# Patient Record
Sex: Male | Born: 1935 | Race: White | Hispanic: No | Marital: Married | Smoking: Never smoker
Health system: Southern US, Community
[De-identification: ages and names within clinical notes are randomized; demographics above are authoritative.]

## PROBLEM LIST (undated history)

## (undated) DIAGNOSIS — C679 Malignant neoplasm of bladder, unspecified: Secondary | ICD-10-CM

## (undated) DIAGNOSIS — I714 Abdominal aortic aneurysm, without rupture: Secondary | ICD-10-CM

## (undated) DIAGNOSIS — E119 Type 2 diabetes mellitus without complications: Secondary | ICD-10-CM

## (undated) DIAGNOSIS — I480 Paroxysmal atrial fibrillation: Secondary | ICD-10-CM

## (undated) DIAGNOSIS — I10 Essential (primary) hypertension: Secondary | ICD-10-CM

## (undated) DIAGNOSIS — C649 Malignant neoplasm of unspecified kidney, except renal pelvis: Secondary | ICD-10-CM

## (undated) HISTORY — PX: ABDOMINAL AORTIC ANEURYSM REPAIR: SUR1152

## (undated) HISTORY — PX: ABDOMINAL SURGERY: SHX537

---

## 2017-07-11 ENCOUNTER — Inpatient Hospital Stay (HOSPITAL_COMMUNITY)
Admission: EM | Admit: 2017-07-11 | Discharge: 2017-07-18 | DRG: 872 | Disposition: A | Discharge status: Hospice | Payer: Medicare Other | Attending: Internal Medicine | Admitting: Internal Medicine

## 2017-07-11 ENCOUNTER — Other Ambulatory Visit: Payer: Self-pay

## 2017-07-11 ENCOUNTER — Emergency Department (HOSPITAL_COMMUNITY): Payer: Medicare Other

## 2017-07-11 ENCOUNTER — Encounter (HOSPITAL_COMMUNITY): Payer: Self-pay | Admitting: *Deleted

## 2017-07-11 DIAGNOSIS — R319 Hematuria, unspecified: Secondary | ICD-10-CM

## 2017-07-11 DIAGNOSIS — Z85528 Personal history of other malignant neoplasm of kidney: Secondary | ICD-10-CM | POA: Diagnosis not present

## 2017-07-11 DIAGNOSIS — F329 Major depressive disorder, single episode, unspecified: Secondary | ICD-10-CM | POA: Diagnosis present

## 2017-07-11 DIAGNOSIS — R296 Repeated falls: Secondary | ICD-10-CM | POA: Diagnosis present

## 2017-07-11 DIAGNOSIS — E785 Hyperlipidemia, unspecified: Secondary | ICD-10-CM | POA: Diagnosis present

## 2017-07-11 DIAGNOSIS — R338 Other retention of urine: Secondary | ICD-10-CM | POA: Diagnosis not present

## 2017-07-11 DIAGNOSIS — E860 Dehydration: Secondary | ICD-10-CM | POA: Diagnosis present

## 2017-07-11 DIAGNOSIS — I10 Essential (primary) hypertension: Secondary | ICD-10-CM | POA: Diagnosis present

## 2017-07-11 DIAGNOSIS — R310 Gross hematuria: Secondary | ICD-10-CM | POA: Diagnosis present

## 2017-07-11 DIAGNOSIS — Z515 Encounter for palliative care: Secondary | ICD-10-CM | POA: Diagnosis not present

## 2017-07-11 DIAGNOSIS — Z66 Do not resuscitate: Secondary | ICD-10-CM

## 2017-07-11 DIAGNOSIS — N390 Urinary tract infection, site not specified: Secondary | ICD-10-CM

## 2017-07-11 DIAGNOSIS — Z9181 History of falling: Secondary | ICD-10-CM

## 2017-07-11 DIAGNOSIS — R531 Weakness: Secondary | ICD-10-CM

## 2017-07-11 DIAGNOSIS — N179 Acute kidney failure, unspecified: Secondary | ICD-10-CM | POA: Diagnosis present

## 2017-07-11 DIAGNOSIS — Z8551 Personal history of malignant neoplasm of bladder: Secondary | ICD-10-CM

## 2017-07-11 DIAGNOSIS — R630 Anorexia: Secondary | ICD-10-CM | POA: Diagnosis present

## 2017-07-11 DIAGNOSIS — N189 Chronic kidney disease, unspecified: Secondary | ICD-10-CM | POA: Diagnosis present

## 2017-07-11 DIAGNOSIS — J8410 Pulmonary fibrosis, unspecified: Secondary | ICD-10-CM | POA: Diagnosis present

## 2017-07-11 DIAGNOSIS — R918 Other nonspecific abnormal finding of lung field: Secondary | ICD-10-CM | POA: Diagnosis present

## 2017-07-11 DIAGNOSIS — N136 Pyonephrosis: Secondary | ICD-10-CM | POA: Diagnosis present

## 2017-07-11 DIAGNOSIS — F0390 Unspecified dementia without behavioral disturbance: Secondary | ICD-10-CM | POA: Diagnosis present

## 2017-07-11 DIAGNOSIS — I129 Hypertensive chronic kidney disease with stage 1 through stage 4 chronic kidney disease, or unspecified chronic kidney disease: Secondary | ICD-10-CM | POA: Diagnosis present

## 2017-07-11 DIAGNOSIS — E278 Other specified disorders of adrenal gland: Secondary | ICD-10-CM | POA: Diagnosis present

## 2017-07-11 DIAGNOSIS — Z79899 Other long term (current) drug therapy: Secondary | ICD-10-CM

## 2017-07-11 DIAGNOSIS — K449 Diaphragmatic hernia without obstruction or gangrene: Secondary | ICD-10-CM | POA: Diagnosis present

## 2017-07-11 DIAGNOSIS — C7951 Secondary malignant neoplasm of bone: Secondary | ICD-10-CM | POA: Diagnosis not present

## 2017-07-11 DIAGNOSIS — E871 Hypo-osmolality and hyponatremia: Secondary | ICD-10-CM | POA: Diagnosis present

## 2017-07-11 DIAGNOSIS — N1330 Unspecified hydronephrosis: Secondary | ICD-10-CM | POA: Diagnosis not present

## 2017-07-11 DIAGNOSIS — I2510 Atherosclerotic heart disease of native coronary artery without angina pectoris: Secondary | ICD-10-CM | POA: Diagnosis present

## 2017-07-11 DIAGNOSIS — C787 Secondary malignant neoplasm of liver and intrahepatic bile duct: Secondary | ICD-10-CM | POA: Diagnosis present

## 2017-07-11 DIAGNOSIS — F32A Depression, unspecified: Secondary | ICD-10-CM | POA: Diagnosis present

## 2017-07-11 DIAGNOSIS — E876 Hypokalemia: Secondary | ICD-10-CM | POA: Diagnosis present

## 2017-07-11 DIAGNOSIS — A419 Sepsis, unspecified organism: Principal | ICD-10-CM | POA: Diagnosis present

## 2017-07-11 DIAGNOSIS — E1136 Type 2 diabetes mellitus with diabetic cataract: Secondary | ICD-10-CM | POA: Diagnosis present

## 2017-07-11 DIAGNOSIS — Z8673 Personal history of transient ischemic attack (TIA), and cerebral infarction without residual deficits: Secondary | ICD-10-CM

## 2017-07-11 DIAGNOSIS — I480 Paroxysmal atrial fibrillation: Secondary | ICD-10-CM | POA: Diagnosis present

## 2017-07-11 DIAGNOSIS — I4891 Unspecified atrial fibrillation: Secondary | ICD-10-CM

## 2017-07-11 DIAGNOSIS — I700 Atherosclerosis of aorta: Secondary | ICD-10-CM | POA: Diagnosis present

## 2017-07-11 DIAGNOSIS — Z905 Acquired absence of kidney: Secondary | ICD-10-CM

## 2017-07-11 DIAGNOSIS — Z7982 Long term (current) use of aspirin: Secondary | ICD-10-CM

## 2017-07-11 DIAGNOSIS — Z885 Allergy status to narcotic agent status: Secondary | ICD-10-CM

## 2017-07-11 DIAGNOSIS — I358 Other nonrheumatic aortic valve disorders: Secondary | ICD-10-CM | POA: Diagnosis present

## 2017-07-11 DIAGNOSIS — Z87440 Personal history of urinary (tract) infections: Secondary | ICD-10-CM

## 2017-07-11 DIAGNOSIS — I252 Old myocardial infarction: Secondary | ICD-10-CM

## 2017-07-11 HISTORY — DX: Malignant neoplasm of unspecified kidney, except renal pelvis: C64.9

## 2017-07-11 HISTORY — DX: Abdominal aortic aneurysm, without rupture: I71.4

## 2017-07-11 HISTORY — DX: Essential (primary) hypertension: I10

## 2017-07-11 HISTORY — DX: Type 2 diabetes mellitus without complications: E11.9

## 2017-07-11 HISTORY — DX: Malignant neoplasm of bladder, unspecified: C67.9

## 2017-07-11 HISTORY — DX: Paroxysmal atrial fibrillation: I48.0

## 2017-07-11 LAB — CBC WITH DIFFERENTIAL/PLATELET
Basophils Absolute: 0 10*3/uL (ref 0.0–0.1)
Basophils Relative: 0 %
EOS ABS: 0.1 10*3/uL (ref 0.0–0.7)
EOS PCT: 1 %
HEMATOCRIT: 38.8 % — AB (ref 39.0–52.0)
Hemoglobin: 13.2 g/dL (ref 13.0–17.0)
Lymphocytes Relative: 7 %
Lymphs Abs: 0.9 10*3/uL (ref 0.7–4.0)
MCH: 32.9 pg (ref 26.0–34.0)
MCHC: 34 g/dL (ref 30.0–36.0)
MCV: 96.8 fL (ref 78.0–100.0)
MONO ABS: 1.4 10*3/uL — AB (ref 0.1–1.0)
MONOS PCT: 10 %
Neutro Abs: 11.7 10*3/uL — ABNORMAL HIGH (ref 1.7–7.7)
Neutrophils Relative %: 82 %
Platelets: 177 10*3/uL (ref 150–400)
RBC: 4.01 MIL/uL — ABNORMAL LOW (ref 4.22–5.81)
RDW: 13.6 % (ref 11.5–15.5)
WBC: 14.1 10*3/uL — ABNORMAL HIGH (ref 4.0–10.5)

## 2017-07-11 LAB — COMPREHENSIVE METABOLIC PANEL
ALT: 57 U/L (ref 17–63)
ANION GAP: 13 (ref 5–15)
AST: 46 U/L — ABNORMAL HIGH (ref 15–41)
Albumin: 4.5 g/dL (ref 3.5–5.0)
Alkaline Phosphatase: 150 U/L — ABNORMAL HIGH (ref 38–126)
BILIRUBIN TOTAL: 1.6 mg/dL — AB (ref 0.3–1.2)
BUN: 28 mg/dL — ABNORMAL HIGH (ref 6–20)
CO2: 24 mmol/L (ref 22–32)
Calcium: 9.8 mg/dL (ref 8.9–10.3)
Chloride: 91 mmol/L — ABNORMAL LOW (ref 101–111)
Creatinine, Ser: 1.26 mg/dL — ABNORMAL HIGH (ref 0.61–1.24)
GFR calc Af Amer: 59 mL/min — ABNORMAL LOW (ref >60)
GFR calc non Af Amer: 51 mL/min — ABNORMAL LOW (ref >60)
GLUCOSE: 133 mg/dL — AB (ref 65–99)
POTASSIUM: 3.9 mmol/L (ref 3.5–5.1)
Sodium: 128 mmol/L — ABNORMAL LOW (ref 135–145)
TOTAL PROTEIN: 7.6 g/dL (ref 6.5–8.1)

## 2017-07-11 LAB — URINALYSIS, ROUTINE W REFLEX MICROSCOPIC
BILIRUBIN URINE: NEGATIVE
GLUCOSE, UA: NEGATIVE mg/dL
KETONES UR: 5 mg/dL — AB
LEUKOCYTES UA: NEGATIVE
Nitrite: NEGATIVE
PH: 5 (ref 5.0–8.0)
Protein, ur: 100 mg/dL — AB
Specific Gravity, Urine: 1.014 (ref 1.005–1.030)

## 2017-07-11 LAB — TROPONIN I: Troponin I: <0.03 ng/mL (ref <0.03)

## 2017-07-11 MED ORDER — DOCUSATE SODIUM 100 MG PO CAPS: 100.0000 mg | ORAL_CAPSULE | Freq: Every day | ORAL | Status: DC | PRN

## 2017-07-11 MED ORDER — ONDANSETRON HCL 4 MG PO TABS: 4.0000 mg | ORAL_TABLET | Freq: Four times a day (QID) | ORAL | Status: DC | PRN

## 2017-07-11 MED ORDER — AMLODIPINE BESYLATE 5 MG PO TABS
10.0000 mg | ORAL_TABLET | Freq: Every day | ORAL | Status: DC
  Administered 2017-07-12 – 2017-07-17 (×6): 10 mg via ORAL
  Filled 2017-07-11 (×6): qty 2

## 2017-07-11 MED ORDER — DEXTROSE 5 % IV SOLN
1.0000 g | Freq: Once | INTRAVENOUS | Status: AC
  Administered 2017-07-12: 1 g via INTRAVENOUS
  Filled 2017-07-11: qty 10

## 2017-07-11 MED ORDER — ASPIRIN EC 81 MG PO TBEC
81.0000 mg | DELAYED_RELEASE_TABLET | Freq: Every day | ORAL | Status: DC
  Administered 2017-07-12 – 2017-07-18 (×7): 81 mg via ORAL
  Filled 2017-07-11 (×7): qty 1

## 2017-07-11 MED ORDER — ONDANSETRON HCL 4 MG/2ML IJ SOLN: 4.0000 mg | Freq: Four times a day (QID) | INTRAMUSCULAR | Status: DC | PRN

## 2017-07-11 MED ORDER — ACETAMINOPHEN 650 MG RE SUPP: 650.0000 mg | Freq: Four times a day (QID) | RECTAL | Status: DC | PRN

## 2017-07-11 MED ORDER — ESCITALOPRAM OXALATE 10 MG PO TABS
10.0000 mg | ORAL_TABLET | Freq: Every day | ORAL | Status: DC
  Administered 2017-07-12 – 2017-07-17 (×6): 10 mg via ORAL
  Filled 2017-07-11 (×9): qty 1

## 2017-07-11 MED ORDER — DONEPEZIL HCL 5 MG PO TABS
5.0000 mg | ORAL_TABLET | Freq: Every day | ORAL | Status: DC
  Administered 2017-07-12 – 2017-07-17 (×7): 5 mg via ORAL
  Filled 2017-07-11 (×10): qty 1

## 2017-07-11 MED ORDER — ALBUTEROL SULFATE (2.5 MG/3ML) 0.083% IN NEBU: 2.5000 mg | INHALATION_SOLUTION | Freq: Four times a day (QID) | RESPIRATORY_TRACT | Status: DC | PRN

## 2017-07-11 MED ORDER — SODIUM CHLORIDE 0.9 % IV SOLN
INTRAVENOUS | Status: DC
  Administered 2017-07-12 – 2017-07-16 (×9): via INTRAVENOUS

## 2017-07-11 MED ORDER — ACETAMINOPHEN 325 MG PO TABS
650.0000 mg | ORAL_TABLET | Freq: Four times a day (QID) | ORAL | Status: DC | PRN
  Administered 2017-07-12 – 2017-07-14 (×3): 650 mg via ORAL
  Filled 2017-07-11 (×3): qty 2

## 2017-07-11 MED ORDER — CARVEDILOL 12.5 MG PO TABS
12.5000 mg | ORAL_TABLET | Freq: Two times a day (BID) | ORAL | Status: DC
  Administered 2017-07-12 – 2017-07-18 (×12): 12.5 mg via ORAL
  Filled 2017-07-11 (×13): qty 1

## 2017-07-11 MED ORDER — ATORVASTATIN CALCIUM 20 MG PO TABS
20.0000 mg | ORAL_TABLET | Freq: Every day | ORAL | Status: DC
  Administered 2017-07-12 – 2017-07-16 (×5): 20 mg via ORAL
  Filled 2017-07-11 (×7): qty 1

## 2017-07-11 MED ORDER — BENAZEPRIL HCL 10 MG PO TABS
20.0000 mg | ORAL_TABLET | Freq: Every day | ORAL | Status: DC
  Administered 2017-07-12 (×2): 20 mg via ORAL
  Filled 2017-07-11 (×2): qty 1
  Filled 2017-07-11 (×2): qty 2
  Filled 2017-07-11: qty 1

## 2017-07-11 MED ORDER — SODIUM CHLORIDE 0.9 % IV BOLUS (SEPSIS)
1000.0000 mL | Freq: Once | INTRAVENOUS | Status: AC
Start: 2017-07-11 — End: 2017-07-11
  Administered 2017-07-11: 1000 mL via INTRAVENOUS

## 2017-07-11 NOTE — ED Notes (Signed)
Pt reports he has been falling frequently for the last few days.  States he last fell today, does not recall if he hit his head.  Pt states he got weak and fell and he might have passed out some of the other times he fell.  EDP notified

## 2017-07-11 NOTE — ED Triage Notes (Signed)
Pt brought in by Ricky Blankenship for possible sepsis; Dr. Lunette Stands wanted pt to be taken to hospital and be checked out; pt was recently discharged from the hospital for respiratory infection; pt is alert but family reports pt has been sleeping a lot today and decrease in urine; pt's cbg 120

## 2017-07-11 NOTE — ED Provider Notes (Signed)
Care discussed with the family, I examined the patient and obtain my own history.  I agree with Dr. Lacinda Axon.  The patient is generally weak, there is hematuria which could represent an infection.  He does have a leukocytosis of 14,000.  I have discussed his care with the hospitalist, Dr. Tamala Julian who will admit the patient to the hospital.   Noemi Chapel, MD 07/11/17 2336

## 2017-07-11 NOTE — ED Provider Notes (Addendum)
[facility] EMERGENCY DEPARTMENT Provider Note   CSN: [id number] Arrival date & time: 07/11/17  2040     History   Chief Complaint Chief Complaint  Patient presents with  . Generalized Body Aches    HPI Ricky Blankenship is a 83 y.o. male.  Level 5 caveat for weakness.  Patient lives in [place].  He has had generalized weakness since a bout of bronchitis approximately 4 weeks ago.  He has had trouble standing up and walking.  Past medical history includes hypertension, diabetes, MI in 1986, AAA repair in 1996, renal cancer, stent in residual kidney, intermittent atrial fibrillation.  No chest pain, dyspnea, fever, sweats, chills, dysuria.      Past Medical History:  Diagnosis Date  . Abdominal aortic aneurysm (AAA) ([place])   . Bladder cancer ([place])   . Cancer of kidney excluding renal pelvis ([place])   . Diabetes mellitus without complication ([place])   . Hypertension     There are no active problems to display for this patient.   Past Surgical History:  Procedure Laterality Date  . ABDOMINAL AORTIC ANEURYSM REPAIR    . ABDOMINAL SURGERY         Home Medications    Prior to Admission medications   Medication Sig Start Date End Date Taking? Authorizing Provider  amLODipine (NORVASC) 10 MG tablet Take 10 mg daily by mouth.   Yes [provider]  aspirin EC 81 MG tablet Take 81 mg daily by mouth.   Yes [provider]  atorvastatin (LIPITOR) 20 MG tablet Take 20 mg daily by mouth.   Yes [provider]  benazepril (LOTENSIN) 20 MG tablet Take 20 mg at bedtime by mouth.   Yes [provider]  carvedilol (COREG) 12.5 MG tablet Take 12.5 mg 2 (two) times daily with a meal by mouth.   Yes [provider]  docusate sodium (COLACE) 100 MG capsule Take 100 mg daily by mouth.   Yes [provider]  donepezil (ARICEPT) 5 MG tablet Take 5 mg at bedtime by mouth.   Yes [provider]  escitalopram (LEXAPRO) 10 MG  tablet Take 10 mg daily by mouth.   Yes [provider]  Flax Oil-Fish Oil-Borage Oil (FISH-FLAX-BORAGE) CAPS Take 1 capsule daily by mouth.   Yes [provider]  Multiple Vitamins-Minerals (EQ COMPLETE MULTIVIT ADULT 50+) TABS Take 1 tablet daily by mouth.   Yes [provider]    Family History History reviewed. No pertinent family history.  Social History Social History   Tobacco Use  . Smoking status: Never Smoker  . Smokeless tobacco: Never Used  Substance Use Topics  . Alcohol use: No    Frequency: Never  . Drug use: No     Allergies   Morphine and related   Review of Systems Review of Systems  Unable to perform ROS: Acuity of condition     Physical Exam Updated Vital Signs BP 116/75 (BP Location: Left Arm)   Pulse 99   Temp 98.5 F (36.9 C) (Oral)   Resp (!) 22   Ht 5\' 8"  (1.727 m)   Wt 97.5 kg (215 lb)   SpO2 96%   BMI 32.69 kg/m   Physical Exam  Constitutional: He is oriented to person, place, and time.  Pale, alert, nad  HENT:  Head: Normocephalic and atraumatic.  Eyes: Conjunctivae are normal.  Neck: Neck supple.  Cardiovascular: Normal rate and regular rhythm.  Pulmonary/Chest: Effort normal and breath sounds normal.  Abdominal: Soft. Bowel sounds are normal.  Musculoskeletal: Normal range of motion.  Neurological: He is alert and oriented to person, place, and time.  Skin: Skin is warm and dry.  Psychiatric: He has a normal mood and affect. His behavior is normal.  Nursing note and vitals reviewed.    ED Treatments / Results  Labs (all labs ordered are listed, but only abnormal results are displayed) Labs Reviewed  CBC WITH DIFFERENTIAL/PLATELET  COMPREHENSIVE METABOLIC PANEL  URINALYSIS, ROUTINE W REFLEX MICROSCOPIC  TROPONIN I    EKG  EKG Interpretation  Date/Time:  Friday July 11 2017 21:12:49 EST Ventricular Rate:  119 PR Interval:    QRS Duration: 122 QT Interval:  351 QTC  Calculation: 494 R Axis:   86 Text Interpretation:  Atrial fibrillation Nonspecific intraventricular conduction delay Borderline ST depression, diffuse leads Confirmed by Nat Christen [phone]) on 07/11/2017 9:26:36 PM       Radiology No results found.  Procedures Procedures (including critical care time)  Medications Ordered in ED Medications  sodium chloride 0.9 % bolus 1,000 mL (not administered)     Initial Impression / Assessment and Plan / ED Course  I have reviewed the triage vital signs and the nursing notes.  Pertinent labs & imaging results that were available during my care of the patient were reviewed by me and considered in my medical decision making (see chart for details).    Daughter reports general decline in his condition since a viral illness approximately 1 month ago.  Will get screening labs, chest x-ray, urinalysis, start IV fluids.  Discussed with Dr. Sabra Heck   Final Clinical Impressions(s) / ED Diagnoses   Final diagnoses:  Weakness  Atrial fibrillation, unspecified type [facility])    ED Discharge Orders    None       Nat Christen, MD 07/11/17 2125    Nat Christen, MD 07/11/17 2147    Nat Christen, MD 07/11/17 2207

## 2017-07-11 NOTE — H&P (Signed)
History and Physical    Ricky Blankenship NLZ:[id number] DOB: 1932-03-07 DOA: 07/11/2017  Referring MD/NP/PA: Dr. Noemi Chapel PCP: Kendrick Ranch, MD  Patient coming from: Home  Chief Complaint: Weakness   HPI: Ricky Blankenship is a 83 y.o. male with medical history significant of HTN, PAF not on anticoagulation, CAD, MI in 1986, AAA s/p surgical repair, mild dementia, RCC s/p nephrectomy, and bladder cancer; who presents with complaints of generalized weakness. Patient and his daughter to provide history.  All of the patient's doctors are in [place], [place].  At baseline the patient lives at home with his wife and is her primary caregiver. Symptoms appear to have been progressively worsening over the last 4 weeks after patient had been diagnosed with bronchitis.  He notes that he may have been given antibiotics and took them as directed.  He has had generalized weakness and has gotten to the point in which he is unable to walk over the last week.  Previously falling once or twice per week now every day.  He reports his legs just not being able to hold him up.  Associated symptoms include incontinence of urine/bowel, chills, poor oral intake, and reports of new tremors.  Denies any recent changes in medications or chest pain. He was seen in the emergency department in [place] 5 days ago, but noted to have low sodium levels and discharged home.  His PCP had come to see him today and recommended him to come to the hospital for further evaluation.  The patient is followed by Dr. Edwin Dada of urology and reportedly has been routinely followed up.  Patient last received chemo sometime this summer for recurrence of bladder cancer, but reportedly has been cancer free at this time.  Patient previously was noted to to be in atrial fibrillation, but reports that he was evaluated by a cardiologist but never started on blood thinners.  ED Course: Upon admission into the emergency department  patient was noted to be afebrile, pulse 99-114, respirations 17-29, blood pressures maintained, and O2 saturations maintained on room air.  Labs revealed WBC 14.1, sodium 128, potassium 3.9, chloride 91, BUN 28, creatinine 1.26, AST 46, ALT 57, AP 150, and total bilirubin 1.6.  Chest x-ray showed no acute abnormalities. CT scan of the brain showed no acute abnormalities.  Urinalysis was positive for large hemoglobin, rare bacteria, TNTC RBCs, and 6-30 WBCs.  Patient was given 1 L normal saline IV fluids and started on Rocephin for the possibility of a urinary tract infection.  Review of Systems  Constitutional: Positive for chills and malaise/fatigue.  HENT: Negative for ear discharge and nosebleeds.   Eyes: Negative for pain and discharge.  Respiratory: Negative for cough and shortness of breath.   Cardiovascular: Negative for chest pain and leg swelling.  Gastrointestinal: Positive for diarrhea and nausea. Negative for vomiting.  Genitourinary: Positive for frequency. Negative for hematuria.       Positive for urinary incontinence  Musculoskeletal: Positive for falls.  Skin: Negative for itching and rash.  Neurological: Positive for weakness. Negative for speech change, focal weakness and loss of consciousness.  Psychiatric/Behavioral: Positive for memory loss. Negative for substance abuse.    Past Medical History:  Diagnosis Date  . Abdominal aortic aneurysm (AAA) ([place])   . Bladder cancer ([place])   . Cancer of kidney excluding renal pelvis ([place])   . Diabetes mellitus without complication ([place])   . Hypertension   . PAF (paroxysmal atrial fibrillation) ([place])  Past Surgical History:  Procedure Laterality Date  . ABDOMINAL AORTIC ANEURYSM REPAIR    . ABDOMINAL SURGERY       reports that  has never smoked. he has never used smokeless tobacco. He reports that he does not drink alcohol or use drugs.  Allergies  Allergen Reactions  . Morphine And Related     Levels drop too low     History reviewed. No pertinent family history.  Prior to Admission medications   Medication Sig Start Date End Date Taking? Authorizing Provider  amLODipine (NORVASC) 10 MG tablet Take 10 mg daily by mouth.   Yes [provider]  aspirin EC 81 MG tablet Take 81 mg daily by mouth.   Yes [provider]  atorvastatin (LIPITOR) 20 MG tablet Take 20 mg daily by mouth.   Yes [provider]  benazepril (LOTENSIN) 20 MG tablet Take 20 mg at bedtime by mouth.   Yes [provider]  carvedilol (COREG) 12.5 MG tablet Take 12.5 mg 2 (two) times daily with a meal by mouth.   Yes [provider]  docusate sodium (COLACE) 100 MG capsule Take 100 mg daily by mouth.   Yes [provider]  donepezil (ARICEPT) 5 MG tablet Take 5 mg at bedtime by mouth.   Yes [provider]  escitalopram (LEXAPRO) 10 MG tablet Take 10 mg daily by mouth.   Yes [provider]  Flax Oil-Fish Oil-Borage Oil (FISH-FLAX-BORAGE) CAPS Take 1 capsule daily by mouth.   Yes [provider]  Multiple Vitamins-Minerals (EQ COMPLETE MULTIVIT ADULT 50+) TABS Take 1 tablet daily by mouth.   Yes [provider]    Physical Exam:  Constitutional: Elderly male who appears in NAD, calm, comfortable Vitals:   07/11/17 2100 07/11/17 2130 07/11/17 2200 07/11/17 2300  BP: (!) 120/91 123/78 (!) 172/79 (!) 140/95  Pulse: (!) 101 (!) 114    Resp: (!) 21 (!) 26 (!) 29 17  Temp:      TempSrc:      SpO2: (!) 82% 99% 100% 96%  Weight:      Height:       Eyes: PERRL, lids and conjunctivae normal ENMT: Mucous membranes are dry. Posterior pharynx clear of any exudate or lesions.  Neck: normal, supple, no masses, no thyromegaly Respiratory: clear to auscultation bilaterally, no wheezing, no crackles. Normal respiratory effort. No accessory muscle use.  Cardiovascular: Irregular irregular, no murmurs / rubs / gallops. No extremity edema. 2+ pedal pulses.  No carotid bruits.  Abdomen: no tenderness, midline abdominal fullness noted. No hepatosplenomegaly. Bowel sounds positive.  Musculoskeletal: no clubbing / cyanosis. No joint deformity upper and lower extremities. Good ROM, no contractures. Normal muscle tone.  Skin: no rashes, lesions, ulcers. No induration.  Poor skin turgor. Neurologic: CN 2-12 grossly intact. Sensation intact, DTR normal. Strength 5/5 in all 4.  Psychiatric: Normal judgment and insight. Alert and oriented x 3. Normal mood.     Labs on Admission: I have personally reviewed following labs and imaging studies  CBC: Recent Labs  Lab 07/11/17 2127  WBC 14.1*  NEUTROABS 11.7*  HGB 13.2  HCT 38.8*  MCV 96.8  PLT 016   Basic Metabolic Panel: Recent Labs  Lab 07/11/17 2127  NA 128*  K 3.9  CL 91*  CO2 24  GLUCOSE 133*  BUN 28*  CREATININE 1.26*  CALCIUM 9.8   GFR: Estimated Creatinine Clearance: 50.3 mL/min (A) (by C-G formula based on SCr of 1.26 mg/dL (  H)). Liver Function Tests: Recent Labs  Lab 07/11/17 2127  AST 46*  ALT 57  ALKPHOS 150*  BILITOT 1.6*  PROT 7.6  ALBUMIN 4.5   No results for input(s): LIPASE, AMYLASE in the last 168 hours. No results for input(s): AMMONIA in the last 168 hours. Coagulation Profile: No results for input(s): INR, PROTIME in the last 168 hours. Cardiac Enzymes: Recent Labs  Lab 07/11/17 2127  TROPONINI <0.03   BNP (last 3 results) No results for input(s): PROBNP in the last 8760 hours. HbA1C: No results for input(s): HGBA1C in the last 72 hours. CBG: No results for input(s): GLUCAP in the last 168 hours. Lipid Profile: No results for input(s): CHOL, HDL, LDLCALC, TRIG, CHOLHDL, LDLDIRECT in the last 72 hours. Thyroid Function Tests: No results for input(s): TSH, T4TOTAL, FREET4, T3FREE, THYROIDAB in the last 72 hours. Anemia Panel: No results for input(s): VITAMINB12, FOLATE, FERRITIN, TIBC, IRON, RETICCTPCT in the last 72 hours. Urine analysis:     Component Value Date/Time   COLORURINE YELLOW 07/11/2017 2240   APPEARANCEUR CLOUDY (A) 07/11/2017 2240   LABSPEC 1.014 07/11/2017 2240   PHURINE 5.0 07/11/2017 2240   GLUCOSEU NEGATIVE 07/11/2017 2240   HGBUR LARGE (A) 07/11/2017 2240   BILIRUBINUR NEGATIVE 07/11/2017 2240   KETONESUR 5 (A) 07/11/2017 2240   PROTEINUR 100 (A) 07/11/2017 2240   NITRITE NEGATIVE 07/11/2017 2240   LEUKOCYTESUR NEGATIVE 07/11/2017 2240   Sepsis Labs: No results found for this or any previous visit (from the past 240 hour(s)).   Radiological Exams on Admission: Ct Head Wo Contrast  Result Date: 07/11/2017 CLINICAL DATA:  83 year old male with generalized weakness. EXAM: CT HEAD WITHOUT CONTRAST TECHNIQUE: Contiguous axial images were obtained from the base of the skull through the vertex without intravenous contrast. COMPARISON:  None. FINDINGS: Brain: There is moderate age-related atrophy and chronic microvascular ischemic changes. Small left lentiform nucleus old lacunar infarct. There is no acute intracranial hemorrhage. No mass effect midline shift noted. No extra-axial fluid collection. Vascular: There is atherosclerotic calcification of the cavernous ICA and right MCA. Atherosclerotic calcification of the right vertebral artery. Skull: Normal. Negative for fracture or focal lesion. Sinuses/Orbits: No acute finding. Other: Bilateral cataract surgeries. IMPRESSION: 1. No acute intracranial hemorrhage. 2. Moderate age-related atrophy and chronic microvascular ischemic changes. If symptoms persist, and there are no contraindications, MRI may provide better evaluation if clinically indicated. Electronically Signed   By: Anner Crete M.D.   On: 07/11/2017 22:15   Dg Chest Port 1 View  Result Date: 07/11/2017 CLINICAL DATA:  Weakness for 1 month EXAM: PORTABLE CHEST 1 VIEW COMPARISON:  None. FINDINGS: Cardiac shadow is within normal limits. Aortic calcifications are noted. The lungs are well aerated  bilaterally. No focal infiltrate or sizable effusion is seen. Prominent calcification is noted the costal sternal margins of the first ribs bilaterally. IMPRESSION: No acute abnormality noted. Electronically Signed   By: Inez Catalina M.D.   On: 07/11/2017 21:35    EKG: Independently reviewed.  afibrillation at a rate of 119  Assessment/Plan Suspected sepsis 2/2 UTI: Acute.  Patient presents with WBC 14.1, elevated heart rates, and tachypnea.  UA positive for hematuria and elevated WBCs.  Suspect urinary tract infection as cause of symptoms. - Admit to a telemetry bed - Sepsis protocol initiated - Add on lactic acid - Follow-up blood and urine cultures - Continue Rocephin per pharmacy  Hematuria: Possibly secondary to above.  Note patient with history of malignancy. - Check renal ultrasound -  Monitor intake and output  Acute kidney injury vs. chronic kidney disease:  Patient's initial creatinine noted to be 1.26 and BUN 28.  His baseline creatinine is unknown at this time, but presents with elevated BUN to creatinine to suggest dehydration. - IV fluids of normal saline at 75 mL/h  Generalized weakness, falls, dehydration: acute - Physical therapy to eval and treat in a.m. - Social work consult for possible need of rehab  Hyponatremia: Initial sodium noted to be 128 on admission.  Suspect sodium possibly related to dehydration and/or medication. - IV fluids as seen above - Recheck BMP in a.m.  Paroxysmal atrial fibrillation: Acute.  Patient found to be in atrial fibrillation at rate of 119 bpm.  Patient reports previous history of atrial fibrillation noted back in January, but not on any anticoagulants. CHADsVASc = at least4(HTN, age, and vascular disease).  Patient also noted to be a significant fall risks. - Continue Coreg for rate control check TSH - Check TSH - echocardiogram - May warrant a to consult cardiology in a.m.  Essential hypertension - Continue benazepril, amlodipine,  and Coreg  H/O renal cell carcinoma and bladder cancer: Patient status post nephrectomy and reported chemotherapy followed by Edwin Dada of urology.  Dementia:mild - Continue Aricept  Depression - Continue Lexapro  DVT prophylaxis: scd Code Status: Full Family Communication: Plan of care with the patient and family present at bedside Disposition Plan: TBD Consults called: None  Admission status: Inpatient  Norval Morton MD [facility] Pager [phone]   If 7PM-7AM, please contact night-coverage [email or web address] Password [facility]  07/11/2017, 11:32 PM

## 2017-07-12 ENCOUNTER — Inpatient Hospital Stay (HOSPITAL_COMMUNITY): Payer: Medicare Other

## 2017-07-12 ENCOUNTER — Other Ambulatory Visit: Payer: Self-pay

## 2017-07-12 DIAGNOSIS — R310 Gross hematuria: Secondary | ICD-10-CM | POA: Diagnosis present

## 2017-07-12 DIAGNOSIS — I10 Essential (primary) hypertension: Secondary | ICD-10-CM | POA: Diagnosis present

## 2017-07-12 DIAGNOSIS — E871 Hypo-osmolality and hyponatremia: Secondary | ICD-10-CM | POA: Diagnosis present

## 2017-07-12 DIAGNOSIS — I480 Paroxysmal atrial fibrillation: Secondary | ICD-10-CM | POA: Diagnosis present

## 2017-07-12 DIAGNOSIS — F329 Major depressive disorder, single episode, unspecified: Secondary | ICD-10-CM | POA: Diagnosis present

## 2017-07-12 DIAGNOSIS — R531 Weakness: Secondary | ICD-10-CM

## 2017-07-12 DIAGNOSIS — I4891 Unspecified atrial fibrillation: Secondary | ICD-10-CM

## 2017-07-12 DIAGNOSIS — E860 Dehydration: Secondary | ICD-10-CM | POA: Diagnosis present

## 2017-07-12 DIAGNOSIS — Z85528 Personal history of other malignant neoplasm of kidney: Secondary | ICD-10-CM

## 2017-07-12 DIAGNOSIS — F32A Depression, unspecified: Secondary | ICD-10-CM | POA: Diagnosis present

## 2017-07-12 LAB — BASIC METABOLIC PANEL
Anion gap: 13 (ref 5–15)
BUN: 28 mg/dL — ABNORMAL HIGH (ref 6–20)
CALCIUM: 9.6 mg/dL (ref 8.9–10.3)
CO2: 22 mmol/L (ref 22–32)
CREATININE: 1.17 mg/dL (ref 0.61–1.24)
Chloride: 95 mmol/L — ABNORMAL LOW (ref 101–111)
GFR, EST NON AFRICAN AMERICAN: 56 mL/min — AB (ref >60)
GLUCOSE: 117 mg/dL — AB (ref 65–99)
Potassium: 3.7 mmol/L (ref 3.5–5.1)
Sodium: 130 mmol/L — ABNORMAL LOW (ref 135–145)

## 2017-07-12 LAB — PROTIME-INR
INR: 1.02
Prothrombin Time: 13.3 seconds (ref 11.4–15.2)

## 2017-07-12 LAB — ECHOCARDIOGRAM COMPLETE
Height: 68 in
Weight: 3192.26 oz

## 2017-07-12 LAB — APTT: APTT: 29 s (ref 24–36)

## 2017-07-12 LAB — CBC
HEMATOCRIT: 38.6 % — AB (ref 39.0–52.0)
Hemoglobin: 13.2 g/dL (ref 13.0–17.0)
MCH: 33.1 pg (ref 26.0–34.0)
MCHC: 34.2 g/dL (ref 30.0–36.0)
MCV: 96.7 fL (ref 78.0–100.0)
PLATELETS: 178 10*3/uL (ref 150–400)
RBC: 3.99 MIL/uL — ABNORMAL LOW (ref 4.22–5.81)
RDW: 13.5 % (ref 11.5–15.5)
WBC: 14.3 10*3/uL — ABNORMAL HIGH (ref 4.0–10.5)

## 2017-07-12 LAB — GLUCOSE, CAPILLARY
Glucose-Capillary: 113 mg/dL — ABNORMAL HIGH (ref 65–99)
Glucose-Capillary: 184 mg/dL — ABNORMAL HIGH (ref 65–99)
Glucose-Capillary: 109 mg/dL — ABNORMAL HIGH (ref 65–99)

## 2017-07-12 LAB — LACTIC ACID, PLASMA: LACTIC ACID, VENOUS: 1 mmol/L (ref 0.5–1.9)

## 2017-07-12 LAB — MAGNESIUM: Magnesium: 2.5 mg/dL — ABNORMAL HIGH (ref 1.7–2.4)

## 2017-07-12 MED ORDER — ENSURE ENLIVE PO LIQD
237.0000 mL | Freq: Two times a day (BID) | ORAL | Status: DC
  Administered 2017-07-12 – 2017-07-18 (×12): 237 mL via ORAL

## 2017-07-12 MED ORDER — DEXTROSE 5 % IV SOLN
1.0000 g | INTRAVENOUS | Status: DC
  Administered 2017-07-12 – 2017-07-16 (×5): 1 g via INTRAVENOUS
  Filled 2017-07-12 (×6): qty 10

## 2017-07-12 NOTE — Progress Notes (Signed)
PROGRESS NOTE    Ricky Blankenship  SKA:[id number]  DOB: 1932/03/10  DOA: 07/11/2017 PCP: Kendrick Ranch, MD   Brief Admission Hx: CASSELL VOORHIES is a 83 y.o. male with medical history significant of HTN, PAF not on anticoagulation, CAD, MI in 1986, AAA s/p surgical repair, mild dementia, RCC s/p nephrectomy, and bladder cancer; who presents with complaints of generalized weakness.  MDM/Assessment & Plan:   1. Sepsis secondary to UTI - continue IV ceftriaxone and follow cultures. Continue supportive therapy and care.   2. Hematuria - likely secondary to UTI and also has history of treated bladder cancer.  Follow renal US.  3. AKI - likely prerenal - treating with IVF hydration.  4. Generalized weakness and frequent falls - fall precautions, PT consult.  May need SNF rehab.  5. Hyponatremia - from dehydration - treating with IVF hydration.  6. Paroxysmal Atrial Fibrillation - continue coreg for rate control, no anticoagulation because of high fall risk. Echo pending. TSH pending.  7. Leukocytosis - secondary to infection, follow and trend CBC.  8. Essential Hypertension - stable, controlled, resumed all home meds. 9. History of renal cell carcinoma and bladder cancer - s/p nephrectomy and chemotherapy, followed by Dr. Edwin Dada urologist.  10. Dementia -reported as mild, continue home aricept.  11. Depression - continue lexapro.    DVT prophylaxis: SCDs Code Status: Full  Family Communication: Disposition Plan: TBD   Subjective: Pt without complaints this morning.    Objective: Vitals:   07/11/17 2300 07/12/17 0000 07/12/17 0053 07/12/17 0626  BP: (!) 140/95 (!) 141/75 (!) 141/66 (!) 142/71  Pulse:   (!) 55 82  Resp: 17 (!) 21 18 18   Temp:   97.9 F (36.6 C) 97.9 F (36.6 C)  TempSrc:   Oral Oral  SpO2: 96% 98% 98% 98%  Weight:   90.5 kg (199 lb 8.3 oz)   Height:   5\' 8"  (1.727 m)     Intake/Output Summary (Last 24 hours) at 07/12/2017 0932 Last  data filed at 07/12/2017 0300 Gross per 24 hour  Intake 122.5 ml  Output -  Net 122.5 ml   Filed Weights   07/11/17 2044 07/12/17 0053  Weight: 97.5 kg (215 lb) 90.5 kg (199 lb 8.3 oz)     REVIEW OF SYSTEMS  As per history otherwise all reviewed and reported negative  Exam:  General exam: awake, alert, NAD, cooperative. Pleasant.  Respiratory system: Clear. No increased work of breathing. Cardiovascular system: S1 & S2 heard irregular.  Gastrointestinal system: Abdomen is nondistended, soft and nontender. Normal bowel sounds heard. Central nervous system: Alert, oriented x 2. No focal neurological deficits. Extremities: no cyanosis or clubbing.   Data Reviewed: Basic Metabolic Panel: Recent Labs  Lab 07/11/17 2127 07/12/17 0654  NA 128* 130*  K 3.9 3.7  CL 91* 95*  CO2 24 22  GLUCOSE 133* 117*  BUN 28* 28*  CREATININE 1.26* 1.17  CALCIUM 9.8 9.6  MG  --  2.5*   Liver Function Tests: Recent Labs  Lab 07/11/17 2127  AST 46*  ALT 57  ALKPHOS 150*  BILITOT 1.6*  PROT 7.6  ALBUMIN 4.5   No results for input(s): LIPASE, AMYLASE in the last 168 hours. No results for input(s): AMMONIA in the last 168 hours. CBC: Recent Labs  Lab 07/11/17 2127 07/12/17 0654  WBC 14.1* 14.3*  NEUTROABS 11.7*  --   HGB 13.2 13.2  HCT 38.8* 38.6*  MCV 96.8 96.7  PLT  177 178   Cardiac Enzymes: Recent Labs  Lab 07/11/17 2127  TROPONINI <0.03   CBG (last 3)  Recent Labs    07/12/17 0816  GLUCAP 113*   No results found for this or any previous visit (from the past 240 hour(s)).   Studies: Ct Head Wo Contrast  Result Date: 07/11/2017 CLINICAL DATA:  83 year old male with generalized weakness. EXAM: CT HEAD WITHOUT CONTRAST TECHNIQUE: Contiguous axial images were obtained from the base of the skull through the vertex without intravenous contrast. COMPARISON:  None. FINDINGS: Brain: There is moderate age-related atrophy and chronic microvascular ischemic changes. Small  left lentiform nucleus old lacunar infarct. There is no acute intracranial hemorrhage. No mass effect midline shift noted. No extra-axial fluid collection. Vascular: There is atherosclerotic calcification of the cavernous ICA and right MCA. Atherosclerotic calcification of the right vertebral artery. Skull: Normal. Negative for fracture or focal lesion. Sinuses/Orbits: No acute finding. Other: Bilateral cataract surgeries. IMPRESSION: 1. No acute intracranial hemorrhage. 2. Moderate age-related atrophy and chronic microvascular ischemic changes. If symptoms persist, and there are no contraindications, MRI may provide better evaluation if clinically indicated. Electronically Signed   By: Anner Crete M.D.   On: 07/11/2017 22:15   Dg Chest Port 1 View  Result Date: 07/11/2017 CLINICAL DATA:  Weakness for 1 month EXAM: PORTABLE CHEST 1 VIEW COMPARISON:  None. FINDINGS: Cardiac shadow is within normal limits. Aortic calcifications are noted. The lungs are well aerated bilaterally. No focal infiltrate or sizable effusion is seen. Prominent calcification is noted the costal sternal margins of the first ribs bilaterally. IMPRESSION: No acute abnormality noted. Electronically Signed   By: Inez Catalina M.D.   On: 07/11/2017 21:35     Scheduled Meds: . amLODipine  10 mg Oral Daily  . aspirin EC  81 mg Oral Daily  . atorvastatin  20 mg Oral q1800  . benazepril  20 mg Oral QHS  . carvedilol  12.5 mg Oral BID WC  . donepezil  5 mg Oral QHS  . escitalopram  10 mg Oral Daily  . feeding supplement (ENSURE ENLIVE)  237 mL Oral BID BM   Continuous Infusions: . sodium chloride 75 mL/hr at 07/12/17 0459  . cefTRIAXone (ROCEPHIN)  IV      Principal Problem:   Sepsis secondary to UTI [facility]) Active Problems:   Generalized weakness   Dehydration   AF (paroxysmal atrial fibrillation) (HCC)   Essential hypertension   History of renal cell carcinoma   Depression   Hyponatremia  Time spent:   Irwin Brakeman, MD, FAAFP [facility] Pager [phone]  If 7PM-7AM, please contact night-coverage [email or web address] Password [email or web address] 07/12/2017, 9:32 AM    LOS: 1 day

## 2017-07-12 NOTE — Progress Notes (Signed)
Initial Nutrition Assessment  DOCUMENTATION CODES:  Obesity unspecified  INTERVENTION:  Continue Ensure Enlive po BID, each supplement provides 350 kcal and 20 grams of protein  Monitor PO intake/ Will follow up as clinically indicated  NUTRITION DIAGNOSIS:  Inadequate oral intake related to decreased appetite, acute illness as evidenced by per patient/family report   GOAL:  Patient will meet greater than or equal to 90% of their needs  MONITOR:  PO intake, Labs  REASON FOR ASSESSMENT:  Malnutrition Screening Tool    ASSESSMENT:  83 y/o male PMHx HTN, PAF, CAD, MI, AAA s/p surgical repair, DM, mild dementia, RCC s/p nephrectomy, bladder cancer. Presented due to generalized weakness that has progressively worsened x4 weeks after patient diagnosed with bronchitis. Has also had chills, urine/bowel incontinence, poor oral intake, new tremors. In ED had abnormal labs. Admitted for suspected sepsis 2/2 UTI. Also with Acute afib and AKI.   RD operates remotely on weekends. Per chart, the patient with poor PO intake x4 weeks. There is limited history available as the patient is most often seen in [place].  Current weight is 199 lbs. There is no objective weight history to compare this to.   No documented PO intake. Continue ensure enlive BID. Will monitor PO intake and implement other interventions/follow up as needed  NFPE: Unable to assess  Labs: MG: 2.5, NA: 130, WBC: 14.3, albumin:4.5, BUN/creat:28/1.17, BGs: 115-135 Meds: Aricept, Ensure   Recent Labs  Lab 07/11/17 2127 07/12/17 0654  NA 128* 130*  K 3.9 3.7  CL 91* 95*  CO2 24 22  BUN 28* 28*  CREATININE 1.26* 1.17  CALCIUM 9.8 9.6  MG  --  2.5*  GLUCOSE 133* 117*   NUTRITION - FOCUSED PHYSICAL EXAM: Unable to assess at this time  Diet Order:  Diet Heart Room service appropriate? Yes; Fluid consistency: Thin  EDUCATION NEEDS:  No education needs have been identified at this time  Skin:  MSAD to perineum,  skin tear to sacrum  Last BM:  11/15  Height:  Ht Readings from Last 1 Encounters:  07/12/17 5\' 8"  (1.727 m)   Weight:  Wt Readings from Last 1 Encounters:  07/12/17 199 lb 8.3 oz (90.5 kg)   Ideal Body Weight:  70 kg  BMI:  Body mass index is 30.34 kg/m.  Estimated Nutritional Needs:  Kcal:  1800-2000 (20-22 kcal/kg bw) Protein:  84-98g (1.2-1.4 g/kg ibw) Fluid:  1.8-2 L fluid (1 ml/kcal)  Burtis Junes RD, LDN, CNSC Clinical Nutrition Pager: [phone] 07/12/2017 10:25 AM

## 2017-07-12 NOTE — Progress Notes (Signed)
Pharmacy Antibiotic Note  Ricky Blankenship is a 83 y.o. male admitted on 07/11/2017 with UTI.  Pharmacy has been consulted for Ceftriaxone dosing.  Plan: Ceftriaxone 1gm IV q24h F/U cxs and clinical progress  Height: 5\' 8"  (172.7 cm) Weight: 199 lb 8.3 oz (90.5 kg) IBW/kg (Calculated) : 68.4  Temp (24hrs), Avg:98.1 F (36.7 C), Min:97.9 F (36.6 C), Max:98.5 F (36.9 C)  Recent Labs  Lab 07/11/17 2127 07/12/17 0654  WBC 14.1* 14.3*  CREATININE 1.26* 1.17  LATICACIDVEN  --  1.0    Estimated Creatinine Clearance: 52.2 mL/min (by C-G formula based on SCr of 1.17 mg/dL).    Allergies  Allergen Reactions  . Morphine And Related     Levels drop too low    Antimicrobials this admission: Ceftriaxone 11/16>>   Microbiology results: 11/16 UCx: pending   Thank you for allowing pharmacy to be a part of this patient's care.  Isac Sarna, BS Pharm D, [place] Clinical Pharmacist Pager [phone] 07/12/2017 8:47 AM

## 2017-07-12 NOTE — Progress Notes (Signed)
PT Cancellation Note  Patient Details Name: Ricky Blankenship MRN: [id number] DOB: 1932/08/24   Cancelled Treatment:    Reason Eval/Treat Not Completed: Fatigue/lethargy limiting ability to participate;Patient's level of consciousness   Time spent: 11:22-11:31  Attempted Initial Evaluation today for PT. Upon arrival patient was somnolent with difficulty keeping eyes open. Patient was oriented to self and DOB, however was confused about location, stating"on the circuit in [facility]," and responding to current Month with "July". Patient complained of chills and has a WBC >14. PT notified RN of patient status and will re-attempt eval at later date/time.   Debara Pickett, PT, DPT Physical Therapist with [facility]  07/12/2017 12:10 PM

## 2017-07-12 NOTE — Progress Notes (Signed)
Echocardiogram 2D Echocardiogram has been performed.  Matilde Bash 07/12/2017, 9:08 AM

## 2017-07-13 LAB — BASIC METABOLIC PANEL
ANION GAP: 12 (ref 5–15)
BUN: 44 mg/dL — ABNORMAL HIGH (ref 6–20)
CALCIUM: 8.8 mg/dL — AB (ref 8.9–10.3)
CO2: 21 mmol/L — ABNORMAL LOW (ref 22–32)
Chloride: 98 mmol/L — ABNORMAL LOW (ref 101–111)
Creatinine, Ser: 2.55 mg/dL — ABNORMAL HIGH (ref 0.61–1.24)
GFR, EST AFRICAN AMERICAN: 25 mL/min — AB (ref >60)
GFR, EST NON AFRICAN AMERICAN: 22 mL/min — AB (ref >60)
Glucose, Bld: 130 mg/dL — ABNORMAL HIGH (ref 65–99)
Potassium: 3.6 mmol/L (ref 3.5–5.1)
Sodium: 131 mmol/L — ABNORMAL LOW (ref 135–145)

## 2017-07-13 LAB — CBC
HCT: 35.6 % — ABNORMAL LOW (ref 39.0–52.0)
Hemoglobin: 12.2 g/dL — ABNORMAL LOW (ref 13.0–17.0)
MCH: 32.8 pg (ref 26.0–34.0)
MCHC: 34.3 g/dL (ref 30.0–36.0)
MCV: 95.7 fL (ref 78.0–100.0)
PLATELETS: 156 10*3/uL (ref 150–400)
RBC: 3.72 MIL/uL — AB (ref 4.22–5.81)
RDW: 13.2 % (ref 11.5–15.5)
WBC: 12.4 10*3/uL — AB (ref 4.0–10.5)

## 2017-07-13 LAB — TSH: TSH: 0.76 u[IU]/mL (ref 0.350–4.500)

## 2017-07-13 MED ORDER — FUROSEMIDE 10 MG/ML IJ SOLN
60.0000 mg | Freq: Two times a day (BID) | INTRAMUSCULAR | Status: DC
  Administered 2017-07-13 – 2017-07-14 (×2): 60 mg via INTRAVENOUS
  Filled 2017-07-13 (×2): qty 6

## 2017-07-13 NOTE — Evaluation (Signed)
Physical Therapy Evaluation Patient Details Name: Ricky Blankenship MRN: [id number] DOB: [birth year] Today's Date: 07/13/2017   History of Present Illness  Ricky Blankenship is a 83 y.o. male with medical history significant of HTN, PAF not on anticoagulation, CAD, MI in 1986, AAA s/p surgical repair, mild dementia, RCC s/p nephrectomy, and bladder cancer; who presents with complaints of generalized weakness. Patient and his daughter to provide history.  All of the patient's doctors are in [place], [place].  At baseline the patient lives at home with his wife and is her primary caregiver. Symptoms appear to have been progressively worsening over the last 4 weeks after patient had been diagnosed with bronchitis.  He notes that he may have been given antibiotics and took them as directed.  He has had generalized weakness and has gotten to the point in which he is unable to walk over the last week.  Previously falling once or twice per week now every day.  He reports his legs just not being able to hold him up.  Associated symptoms include incontinence of urine/bowel, chills, poor oral intake, and reports of new tremors.  Denies any recent changes in medications or chest pain. He was seen in the emergency department in [place] 5 days ago, but noted to have low sodium levels and discharged home.  His PCP had come to see him today and recommended him to come to the hospital for further evaluation.  The patient is followed by Dr. Edwin Dada of urology and reportedly has been routinely followed up.  Patient last received chemo sometime this summer for recurrence of bladder cancer, but reportedly has been cancer free at this time.  Patient previously was noted to to be in atrial fibrillation, but reports that he was evaluated by a cardiologist but never started on blood thinners.     Clinical Impression  Ricky Blankenship is a 83 y.o. presenting for PT evaluation with recent decrease in functional mobility  secondary to suspected sepsis. He is currently functioning below her baseline of independent with a rollator to ambulate and now requires min assist for bed mobility and sit to stand transfers. Gait was deferred today due to weakness and safety concerns with urinary incontinence. Mr. Winterhalter reports his wife cannot provide the physical assistance he requires currently if he were to return home. He will benefit from skilled PT at below inpatient setting to address current impairments and progress functional mobility/independence. Acute PT will follow.     Follow Up Recommendations SNF    Equipment Recommendations  None recommended by PT       Precautions / Restrictions Precautions Precautions: Fall Restrictions Weight Bearing Restrictions: No      Mobility  Bed Mobility Overal bed mobility: Needs Assistance Bed Mobility: Rolling;Supine to Sit;Sidelying to Sit;Sit to Supine;Sit to Sidelying Rolling: Min assist Sidelying to sit: Min assist;HOB elevated Supine to sit: Min assist;HOB elevated Sit to supine: Min assist;HOB elevated Sit to sidelying: Min assist;HOB elevated    Transfers Overall transfer level: Needs assistance Equipment used: Rolling walker (2 wheeled) Transfers: Sit to/from Stand Sit to Stand: Min assist    General transfer comment: unable to assess more advanced transfers secondary to fatigue and bladder incontinence  Ambulation/Gait    General Gait Details: unable to assess secondary to fatigue, BLE weakness, and safety concern for slipping/falling due to bladder incontinence       Balance Overall balance assessment: Needs assistance Sitting-balance support: Bilateral upper extremity supported;Feet supported Sitting balance-Leahy Scale: Fair  Postural control: Posterior lean Standing balance support: Bilateral upper extremity supported Standing balance-Leahy Scale: Fair        Pertinent Vitals/Pain Pain Assessment: Faces Pain Score: 0-No pain     Home Living Family/patient expects to be discharged to:: Private residence Living Arrangements: Spouse/significant other   Type of Home: House Home Access: Stairs to enter Entrance Stairs-Rails: Can reach both Entrance Stairs-Number of Steps: 1 Home Layout: One level Home Equipment: [place] - 4 wheels      Prior Function Level of Independence: Independent     Comments: patient was able to mobilize with rollator in home and grocery store. He was able to care for himself and his wife prior to this hospitalization.        Extremity/Trunk Assessment   Upper Extremity Assessment Upper Extremity Assessment: Overall WFL for tasks assessed    Lower Extremity Assessment Lower Extremity Assessment: Generalized weakness;RLE deficits/detail;LLE deficits/detail RLE Deficits / Details: grossly 3/5 with functional and MMT of hip flexion, knee flexion/extension, dorsiflexion LLE Deficits / Details: grossly 3/5 with functional and MMT of hip flexion, knee flexion/extension, dorsiflexion    Cervical / Trunk Assessment Cervical / Trunk Assessment: Kyphotic  Communication   Communication: No difficulties  Cognition Arousal/Alertness: Awake/alert Behavior During Therapy: WFL for tasks assessed/performed Overall Cognitive Status: Within Functional Limits for tasks assessed     General Comments General comments (skin integrity, edema, etc.): patient with onset of bladder incontinence in last 6 months    Exercises General Exercises - Lower Extremity Ankle Circles/Pumps: AROM;Both;10 reps Heel Slides: AROM;Both;10 reps   Assessment/Plan    PT Assessment Patient needs continued PT services  PT Problem List Decreased strength;Decreased range of motion;Decreased activity tolerance;Decreased balance;Decreased mobility       PT Treatment Interventions DME instruction;Balance training;Gait training;Neuromuscular re-education;Stair training;Functional mobility  training;Patient/family education;Therapeutic activities;Therapeutic exercise;Manual techniques    PT Goals (Current goals can be found in the Care Plan section)  Acute Rehab PT Goals Patient Stated Goal: get strong enough to go home and take care of myself and my wife again PT Goal Formulation: With patient Time For Goal Achievement: 07/18/17 Potential to Achieve Goals: Good    Frequency Min 3X/week   Barriers to discharge Decreased caregiver support Patient states his wife is unable to provide the amount of physical assistance he currently needs       AM-PAC PT "6 Clicks" Daily Activity  Outcome Measure Difficulty turning over in bed (including adjusting bedclothes, sheets and blankets)?: Unable Difficulty moving from lying on back to sitting on the side of the bed? : Unable Difficulty sitting down on and standing up from a chair with arms (e.g., wheelchair, bedside commode, etc,.)?: Unable Help needed moving to and from a bed to chair (including a wheelchair)?: A Little Help needed walking in hospital room?: A Little Help needed climbing 3-5 steps with a railing? : Total 6 Click Score: 10    End of Session Equipment Utilized During Treatment: Gait belt Activity Tolerance: Patient tolerated treatment well Patient left: in bed;with call bell/phone within reach;with bed alarm set Nurse Communication: Mobility status PT Visit Diagnosis: Unsteadiness on feet (R26.81);Difficulty in walking, not elsewhere classified (R26.2);Other abnormalities of gait and mobility (R26.89);Repeated falls (R29.6);Muscle weakness (generalized) (M62.81);History of falling (Z91.81)    Time: 0820-0910 PT Time Calculation (min) (ACUTE ONLY): 50 min   Charges:   PT Evaluation $PT Eval Low Complexity: 1 Low PT Treatments $Therapeutic Activity: 23-37 mins   PT G Codes:   PT G-Codes **NOT  FOR INPATIENT CLASS** Functional Assessment Tool Used: AM-PAC 6 Clicks Basic Mobility;Clinical judgement Functional  Limitation: Mobility: Walking and moving around Mobility: Walking and Moving Around Current Status (P5361): At least 60 percent but less than 80 percent impaired, limited or restricted Mobility: Walking and Moving Around Goal Status [phone]): At least 60 percent but less than 80 percent impaired, limited or restricted Mobility: Walking and Moving Around Discharge Status [phone]): At least 60 percent but less than 80 percent impaired, limited or restricted    Debara Pickett, PT, DPT Physical Therapist with [facility]  07/13/2017 11:19 AM

## 2017-07-13 NOTE — Consult Note (Signed)
Reason for Consult: Acute kidney injury Referring Physician: Dr. Glori Blankenship is an 83 y.o. male.  HPI: He is a patient who has history of hypertension, coronary artery disease, history of aortic aneurysm, history of renal cancer, history of bladder cancer presently came to the hospital because of weakness.  Patient denies any nausea or vomiting.  Denies also any difficulty breathing.  When he was evaluated the patient was found to have urinary tract infection and renal failure and is admitted to the hospital.  Patient presently denies any previous history of renal failure or kidney stone.  He said he is feeling better.  He denies any difficulty breathing and no nausea or vomiting.  Past Medical History:  Diagnosis Date  . Abdominal aortic aneurysm (AAA) ([place])   . Bladder cancer ([place])   . Cancer of kidney excluding renal pelvis ([place])   . Diabetes mellitus without complication ([place])   . Hypertension   . PAF (paroxysmal atrial fibrillation) ([place])     Past Surgical History:  Procedure Laterality Date  . ABDOMINAL AORTIC ANEURYSM REPAIR    . ABDOMINAL SURGERY      History reviewed. No pertinent family history.  Social History:  reports that  has never smoked. he has never used smokeless tobacco. He reports that he does not drink alcohol or use drugs.  Allergies:  Allergies  Allergen Reactions  . Morphine And Related     Levels drop too low    Medications: I have reviewed the patient's current medications.  Results for orders placed or performed during the hospital encounter of 07/11/17 (from the past 48 hour(s))  CBC with Differential     Status: Abnormal   Collection Time: 07/11/17  9:27 PM  Result Value Ref Range   WBC 14.1 (H) 4.0 - 10.5 K/uL   RBC 4.01 (L) 4.22 - 5.81 MIL/uL   Hemoglobin 13.2 13.0 - 17.0 g/dL   HCT 38.8 (L) 39.0 - 52.0 %   MCV 96.8 78.0 - 100.0 fL   MCH 32.9 26.0 - 34.0 pg   MCHC 34.0 30.0 - 36.0 g/dL   RDW 13.6 11.5 - 15.5 %   Platelets  177 150 - 400 K/uL   Neutrophils Relative % 82 %   Neutro Abs 11.7 (H) 1.7 - 7.7 K/uL   Lymphocytes Relative 7 %   Lymphs Abs 0.9 0.7 - 4.0 K/uL   Monocytes Relative 10 %   Monocytes Absolute 1.4 (H) 0.1 - 1.0 K/uL   Eosinophils Relative 1 %   Eosinophils Absolute 0.1 0.0 - 0.7 K/uL   Basophils Relative 0 %   Basophils Absolute 0.0 0.0 - 0.1 K/uL  Comprehensive metabolic panel     Status: Abnormal   Collection Time: 07/11/17  9:27 PM  Result Value Ref Range   Sodium 128 (L) 135 - 145 mmol/L   Potassium 3.9 3.5 - 5.1 mmol/L   Chloride 91 (L) 101 - 111 mmol/L   CO2 24 22 - 32 mmol/L   Glucose, Bld 133 (H) 65 - 99 mg/dL   BUN 28 (H) 6 - 20 mg/dL   Creatinine, Ser 1.26 (H) 0.61 - 1.24 mg/dL   Calcium 9.8 8.9 - 10.3 mg/dL   Total Protein 7.6 6.5 - 8.1 g/dL   Albumin 4.5 3.5 - 5.0 g/dL   AST 46 (H) 15 - 41 U/L   ALT 57 17 - 63 U/L   Alkaline Phosphatase 150 (H) 38 - 126 U/L   Total Bilirubin 1.6 (  H) 0.3 - 1.2 mg/dL   GFR calc non Af Amer 51 (L) >60 mL/min   GFR calc Af Amer 59 (L) >60 mL/min    Comment: (NOTE) The eGFR has been calculated using the CKD EPI equation. This calculation has not been validated in all clinical situations. eGFR's persistently <60 mL/min signify possible Chronic Kidney Disease.    Anion gap 13 5 - 15  Troponin I     Status: None   Collection Time: 07/11/17  9:27 PM  Result Value Ref Range   Troponin I <0.03 <0.03 ng/mL  Urinalysis, Routine w reflex microscopic     Status: Abnormal   Collection Time: 07/11/17 10:40 PM  Result Value Ref Range   Color, Urine YELLOW YELLOW   APPearance CLOUDY (A) CLEAR   Specific Gravity, Urine 1.014 1.005 - 1.030   pH 5.0 5.0 - 8.0   Glucose, UA NEGATIVE NEGATIVE mg/dL   Hgb urine dipstick LARGE (A) NEGATIVE   Bilirubin Urine NEGATIVE NEGATIVE   Ketones, ur 5 (A) NEGATIVE mg/dL   Protein, ur 100 (A) NEGATIVE mg/dL   Nitrite NEGATIVE NEGATIVE   Leukocytes, UA NEGATIVE NEGATIVE   RBC / HPF TOO NUMEROUS TO COUNT 0  - 5 RBC/hpf   WBC, UA 6-30 0 - 5 WBC/hpf   Bacteria, UA RARE (A) NONE SEEN   Squamous Epithelial / LPF 0-5 (A) NONE SEEN   Mucus PRESENT   CBC     Status: Abnormal   Collection Time: 07/12/17  6:54 AM  Result Value Ref Range   WBC 14.3 (H) 4.0 - 10.5 K/uL   RBC 3.99 (L) 4.22 - 5.81 MIL/uL   Hemoglobin 13.2 13.0 - 17.0 g/dL   HCT 38.6 (L) 39.0 - 52.0 %   MCV 96.7 78.0 - 100.0 fL   MCH 33.1 26.0 - 34.0 pg   MCHC 34.2 30.0 - 36.0 g/dL   RDW 13.5 11.5 - 15.5 %   Platelets 178 150 - 400 K/uL  Basic metabolic panel     Status: Abnormal   Collection Time: 07/12/17  6:54 AM  Result Value Ref Range   Sodium 130 (L) 135 - 145 mmol/L   Potassium 3.7 3.5 - 5.1 mmol/L   Chloride 95 (L) 101 - 111 mmol/L   CO2 22 22 - 32 mmol/L   Glucose, Bld 117 (H) 65 - 99 mg/dL   BUN 28 (H) 6 - 20 mg/dL   Creatinine, Ser 1.17 0.61 - 1.24 mg/dL   Calcium 9.6 8.9 - 10.3 mg/dL   GFR calc non Af Amer 56 (L) >60 mL/min   GFR calc Af Amer >60 >60 mL/min    Comment: (NOTE) The eGFR has been calculated using the CKD EPI equation. This calculation has not been validated in all clinical situations. eGFR's persistently <60 mL/min signify possible Chronic Kidney Disease.    Anion gap 13 5 - 15  Magnesium     Status: Abnormal   Collection Time: 07/12/17  6:54 AM  Result Value Ref Range   Magnesium 2.5 (H) 1.7 - 2.4 mg/dL  Protime-INR     Status: None   Collection Time: 07/12/17  6:54 AM  Result Value Ref Range   Prothrombin Time 13.3 11.4 - 15.2 seconds   INR 1.02   APTT     Status: None   Collection Time: 07/12/17  6:54 AM  Result Value Ref Range   aPTT 29 24 - 36 seconds  Lactic acid, plasma     Status: None  Collection Time: 07/12/17  6:54 AM  Result Value Ref Range   Lactic Acid, Venous 1.0 0.5 - 1.9 mmol/L  Glucose, capillary     Status: Abnormal   Collection Time: 07/12/17  8:16 AM  Result Value Ref Range   Glucose-Capillary 113 (H) 65 - 99 mg/dL  Glucose, capillary     Status: Abnormal    Collection Time: 07/12/17 11:43 AM  Result Value Ref Range   Glucose-Capillary 184 (H) 65 - 99 mg/dL  Glucose, capillary     Status: Abnormal   Collection Time: 07/12/17  4:31 PM  Result Value Ref Range   Glucose-Capillary 109 (H) 65 - 99 mg/dL  CBC     Status: Abnormal   Collection Time: 07/13/17  6:47 AM  Result Value Ref Range   WBC 12.4 (H) 4.0 - 10.5 K/uL   RBC 3.72 (L) 4.22 - 5.81 MIL/uL   Hemoglobin 12.2 (L) 13.0 - 17.0 g/dL   HCT 35.6 (L) 39.0 - 52.0 %   MCV 95.7 78.0 - 100.0 fL   MCH 32.8 26.0 - 34.0 pg   MCHC 34.3 30.0 - 36.0 g/dL   RDW 13.2 11.5 - 15.5 %   Platelets 156 150 - 400 K/uL  Basic metabolic panel     Status: Abnormal   Collection Time: 07/13/17  6:47 AM  Result Value Ref Range   Sodium 131 (L) 135 - 145 mmol/L   Potassium 3.6 3.5 - 5.1 mmol/L   Chloride 98 (L) 101 - 111 mmol/L   CO2 21 (L) 22 - 32 mmol/L   Glucose, Bld 130 (H) 65 - 99 mg/dL   BUN 44 (H) 6 - 20 mg/dL   Creatinine, Ser 2.55 (H) 0.61 - 1.24 mg/dL    Comment: DELTA CHECK NOTED   Calcium 8.8 (L) 8.9 - 10.3 mg/dL   GFR calc non Af Amer 22 (L) >60 mL/min   GFR calc Af Amer 25 (L) >60 mL/min    Comment: (NOTE) The eGFR has been calculated using the CKD EPI equation. This calculation has not been validated in all clinical situations. eGFR's persistently <60 mL/min signify possible Chronic Kidney Disease.    Anion gap 12 5 - 15  TSH     Status: None   Collection Time: 07/13/17  6:47 AM  Result Value Ref Range   TSH 0.760 0.350 - 4.500 uIU/mL    Comment: Performed by a 3rd Generation assay with a functional sensitivity of <=0.01 uIU/mL.    Ct Head Wo Contrast  Result Date: 07/11/2017 CLINICAL DATA:  83 year old male with generalized weakness. EXAM: CT HEAD WITHOUT CONTRAST TECHNIQUE: Contiguous axial images were obtained from the base of the skull through the vertex without intravenous contrast. COMPARISON:  None. FINDINGS: Brain: There is moderate age-related atrophy and chronic  microvascular ischemic changes. Small left lentiform nucleus old lacunar infarct. There is no acute intracranial hemorrhage. No mass effect midline shift noted. No extra-axial fluid collection. Vascular: There is atherosclerotic calcification of the cavernous ICA and right MCA. Atherosclerotic calcification of the right vertebral artery. Skull: Normal. Negative for fracture or focal lesion. Sinuses/Orbits: No acute finding. Other: Bilateral cataract surgeries. IMPRESSION: 1. No acute intracranial hemorrhage. 2. Moderate age-related atrophy and chronic microvascular ischemic changes. If symptoms persist, and there are no contraindications, MRI may provide better evaluation if clinically indicated. Electronically Signed   By: Anner Crete M.D.   On: 07/11/2017 22:15   US Renal  Result Date: 07/12/2017 CLINICAL DATA:  Sepsis, UTI.  Prior  left nephrectomy EXAM: RENAL / URINARY TRACT ULTRASOUND COMPLETE COMPARISON:  None. FINDINGS: Right Kidney: Length: 13.0 cm. Moderate hydronephrosis. No visible mass. Normal echotexture. Left Kidney: Length: Prior left nephrectomy. Bladder: Appears normal for degree of bladder distention. IMPRESSION: Moderate right hydronephrosis. Prior left nephrectomy. Electronically Signed   By: Rolm Baptise M.D.   On: 07/12/2017 12:44   Dg Chest Port 1 View  Result Date: 07/11/2017 CLINICAL DATA:  Weakness for 1 month EXAM: PORTABLE CHEST 1 VIEW COMPARISON:  None. FINDINGS: Cardiac shadow is within normal limits. Aortic calcifications are noted. The lungs are well aerated bilaterally. No focal infiltrate or sizable effusion is seen. Prominent calcification is noted the costal sternal margins of the first ribs bilaterally. IMPRESSION: No acute abnormality noted. Electronically Signed   By: Inez Catalina M.D.   On: 07/11/2017 21:35    ROS Blood pressure 135/69, pulse 71, temperature 98.2 F (36.8 C), temperature source Oral, resp. rate 17, height '5\' 8"'$  (1.727 m), weight 90.5 kg (199  lb 8.3 oz), SpO2 98 %. Physical Exam  Assessment/Plan: 1]renal failure: Seems to be acute.  At this moment there is no previous blood work to compare it with.  Etiology could be secondary to obstructive uropathy/ACE/ATN/prerenal.  Patient with a single kidney and ultrasound of the kidneys shows. moderate right hydronephrosis.  Presently patient is nonoliguric. 2] history of left renal cancer status post nephrectomy and also history of bladder cancer.  Patient seems to have received chemotherapy.  Patient presently does not remember whether he has received chemotherapy or not. 3] hypertension 4] history of aortic aneurysm status post surgical repair 5] history of coronary artery disease status post MI 6] history of dementia 7] history of UTI 8] history of hematuria: Possibly from Foley catheter insertion. Plan: We will DC Lotensin 2] we will increase IV fluids 125 cc/h 3] will use Lasix 60 mg IV twice daily 4] patient may benefit from urology consult.  Sahirah Rudell S 07/13/2017, 11:27 AM

## 2017-07-13 NOTE — Progress Notes (Signed)
PROGRESS NOTE    Ricky Blankenship  XHB:[id number]  DOB: Jun 18, 1932  DOA: 07/11/2017 PCP: Kendrick Ranch, MD   Brief Admission Hx: Ricky Blankenship is a 83 y.o. male with medical history significant of HTN, PAF not on anticoagulation, CAD, MI in 1986, AAA s/p surgical repair, mild dementia, RCC s/p nephrectomy, and bladder cancer; who presents with complaints of generalized weakness.  MDM/Assessment & Plan:   1. Sepsis secondary to UTI - continue IV ceftriaxone and follow cultures. Continue supportive therapy and care.   2. Hematuria - likely secondary to UTI and also has history of treated bladder cancer.  He is s/p left nephrectomy.  He only has 1 kidney.  3. AKI - worsening renal function today, concerned about obstruction, place foley catheter, renal US shows mod hydronephrosis, ask for renal consultation.    4. Moderate right renal hydronephrosis - will place foley catheter, consult nephrology. Get urology consult tomorrow.  5. Generalized weakness and frequent falls - fall precautions, PT consult pending.  May need SNF rehab.  6. Hyponatremia - from dehydration - treating with IVF hydration.  7. Paroxysmal Atrial Fibrillation - continue coreg for rate control, no anticoagulation because of high fall risk. Echo normal EF grade 1 DD. TSH pending.  8. Leukocytosis - secondary to infection, follow and trend CBC. WBC trending down.  Now 12.4. 9. Essential Hypertension - stable, controlled, resumed all home meds. 10. History of renal cell carcinoma and bladder cancer - s/p left nephrectomy and chemotherapy, followed by Dr. Edwin Blankenship urologist.  11. Dementia -reported as mild, continue home aricept.  12. Depression - continue lexapro.    Echocardiogram 07/12/17 Impressions: - LVEF 60-65%, mild LVH, normal wall motion, grade 1 DD, normal LV   filling pressure, aortic valve sclerosis, trivial MR, normal LA   size, trivial TR, RVSP 30 mmHg, normal IVC.  DVT  prophylaxis: SCDs Code Status: Full  Family Communication: Disposition Plan: TBD  Subjective: Pt more awake and alert, no complaints this morning.      Objective: Vitals:   07/12/17 1539 07/12/17 2035 07/12/17 2047 07/13/17 0700  BP: (!) 123/59 (!) 159/66  135/69  Pulse: (!) 57 71    Resp: 18 18  17   Temp: 98.2 F (36.8 C) 97.9 F (36.6 C)  98.2 F (36.8 C)  TempSrc:  Oral  Oral  SpO2: 99% 97% 97% 98%  Weight:      Height:        Intake/Output Summary (Last 24 hours) at 07/13/2017 0836 Last data filed at 07/13/2017 0303 Gross per 24 hour  Intake 1958.75 ml  Output -  Net 1958.75 ml   Filed Weights   07/11/17 2044 07/12/17 0053  Weight: 97.5 kg (215 lb) 90.5 kg (199 lb 8.3 oz)   REVIEW OF SYSTEMS  As per history otherwise all reviewed and reported negative  Exam:  General exam: awake, alert, NAD, cooperative. Pleasant.  Respiratory system: Clear. No increased work of breathing. Cardiovascular system: S1 & S2 heard irregular.  Gastrointestinal system: Abdomen is soft and nontender. Normal bowel sounds heard. Central nervous system: Alert, oriented x 2. No focal neurological deficits. Extremities: no cyanosis or clubbing.   Data Reviewed: Basic Metabolic Panel: Recent Labs  Lab 07/11/17 2127 07/12/17 0654 07/13/17 0647  NA 128* 130* 131*  K 3.9 3.7 3.6  CL 91* 95* 98*  CO2 24 22 21*  GLUCOSE 133* 117* 130*  BUN 28* 28* 44*  CREATININE 1.26* 1.17 2.55*  CALCIUM 9.8 9.6  8.8*  MG  --  2.5*  --    Liver Function Tests: Recent Labs  Lab 07/11/17 2127  AST 46*  ALT 57  ALKPHOS 150*  BILITOT 1.6*  PROT 7.6  ALBUMIN 4.5   No results for input(s): LIPASE, AMYLASE in the last 168 hours. No results for input(s): AMMONIA in the last 168 hours. CBC: Recent Labs  Lab 07/11/17 2127 07/12/17 0654 07/13/17 0647  WBC 14.1* 14.3* 12.4*  NEUTROABS 11.7*  --   --   HGB 13.2 13.2 12.2*  HCT 38.8* 38.6* 35.6*  MCV 96.8 96.7 95.7  PLT 177 178 156    Cardiac Enzymes: Recent Labs  Lab 07/11/17 2127  TROPONINI <0.03   CBG (last 3)  Recent Labs    07/12/17 0816 07/12/17 1143 07/12/17 1631  GLUCAP 113* 184* 109*   No results found for this or any previous visit (from the past 240 hour(s)).   Studies: Ct Head Wo Contrast  Result Date: 07/11/2017 CLINICAL DATA:  83 year old male with generalized weakness. EXAM: CT HEAD WITHOUT CONTRAST TECHNIQUE: Contiguous axial images were obtained from the base of the skull through the vertex without intravenous contrast. COMPARISON:  None. FINDINGS: Brain: There is moderate age-related atrophy and chronic microvascular ischemic changes. Small left lentiform nucleus old lacunar infarct. There is no acute intracranial hemorrhage. No mass effect midline shift noted. No extra-axial fluid collection. Vascular: There is atherosclerotic calcification of the cavernous ICA and right MCA. Atherosclerotic calcification of the right vertebral artery. Skull: Normal. Negative for fracture or focal lesion. Sinuses/Orbits: No acute finding. Other: Bilateral cataract surgeries. IMPRESSION: 1. No acute intracranial hemorrhage. 2. Moderate age-related atrophy and chronic microvascular ischemic changes. If symptoms persist, and there are no contraindications, MRI may provide better evaluation if clinically indicated. Electronically Signed   By: Anner Crete M.D.   On: 07/11/2017 22:15   US Renal  Result Date: 07/12/2017 CLINICAL DATA:  Sepsis, UTI.  Prior left nephrectomy EXAM: RENAL / URINARY TRACT ULTRASOUND COMPLETE COMPARISON:  None. FINDINGS: Right Kidney: Length: 13.0 cm. Moderate hydronephrosis. No visible mass. Normal echotexture. Left Kidney: Length: Prior left nephrectomy. Bladder: Appears normal for degree of bladder distention. IMPRESSION: Moderate right hydronephrosis. Prior left nephrectomy. Electronically Signed   By: Rolm Baptise M.D.   On: 07/12/2017 12:44   Dg Chest Port 1 View  Result Date:  07/11/2017 CLINICAL DATA:  Weakness for 1 month EXAM: PORTABLE CHEST 1 VIEW COMPARISON:  None. FINDINGS: Cardiac shadow is within normal limits. Aortic calcifications are noted. The lungs are well aerated bilaterally. No focal infiltrate or sizable effusion is seen. Prominent calcification is noted the costal sternal margins of the first ribs bilaterally. IMPRESSION: No acute abnormality noted. Electronically Signed   By: Inez Catalina M.D.   On: 07/11/2017 21:35   Scheduled Meds: . amLODipine  10 mg Oral Daily  . aspirin EC  81 mg Oral Daily  . atorvastatin  20 mg Oral q1800  . benazepril  20 mg Oral QHS  . carvedilol  12.5 mg Oral BID WC  . donepezil  5 mg Oral QHS  . escitalopram  10 mg Oral Daily  . feeding supplement (ENSURE ENLIVE)  237 mL Oral BID BM   Continuous Infusions: . sodium chloride 75 mL/hr at 07/12/17 1857  . cefTRIAXone (ROCEPHIN)  IV Stopped (07/12/17 2230)    Principal Problem:   Sepsis secondary to UTI [facility]) Active Problems:   Generalized weakness   Dehydration   AF (paroxysmal atrial fibrillation) ([place])  Essential hypertension   History of renal cell carcinoma   Depression   Hyponatremia   Gross hematuria  Time spent:   Irwin Brakeman, MD, FAAFP [facility] Pager [phone]  If 7PM-7AM, please contact night-coverage [email or web address] Password [email or web address] 07/13/2017, 8:36 AM    LOS: 2 days

## 2017-07-14 ENCOUNTER — Inpatient Hospital Stay (HOSPITAL_COMMUNITY): Payer: Medicare Other

## 2017-07-14 DIAGNOSIS — R338 Other retention of urine: Secondary | ICD-10-CM

## 2017-07-14 DIAGNOSIS — N1330 Unspecified hydronephrosis: Secondary | ICD-10-CM

## 2017-07-14 LAB — CBC WITH DIFFERENTIAL/PLATELET
BASOS PCT: 0 %
Basophils Absolute: 0 10*3/uL (ref 0.0–0.1)
EOS ABS: 0.2 10*3/uL (ref 0.0–0.7)
Eosinophils Relative: 1 %
HCT: 34 % — ABNORMAL LOW (ref 39.0–52.0)
HEMOGLOBIN: 11.9 g/dL — AB (ref 13.0–17.0)
LYMPHS ABS: 1 10*3/uL (ref 0.7–4.0)
Lymphocytes Relative: 10 %
MCH: 33.3 pg (ref 26.0–34.0)
MCHC: 35 g/dL (ref 30.0–36.0)
MCV: 95.2 fL (ref 78.0–100.0)
Monocytes Absolute: 1.3 10*3/uL — ABNORMAL HIGH (ref 0.1–1.0)
Monocytes Relative: 12 %
NEUTROS PCT: 77 %
Neutro Abs: 8.3 10*3/uL — ABNORMAL HIGH (ref 1.7–7.7)
Platelets: 160 10*3/uL (ref 150–400)
RBC: 3.57 MIL/uL — AB (ref 4.22–5.81)
RDW: 13.3 % (ref 11.5–15.5)
WBC: 10.7 10*3/uL — AB (ref 4.0–10.5)

## 2017-07-14 LAB — RENAL FUNCTION PANEL
Albumin: 3.6 g/dL (ref 3.5–5.0)
Anion gap: 12 (ref 5–15)
BUN: 28 mg/dL — ABNORMAL HIGH (ref 6–20)
CALCIUM: 8.7 mg/dL — AB (ref 8.9–10.3)
CHLORIDE: 98 mmol/L — AB (ref 101–111)
CO2: 23 mmol/L (ref 22–32)
CREATININE: 1.24 mg/dL (ref 0.61–1.24)
GFR, EST NON AFRICAN AMERICAN: 52 mL/min — AB (ref >60)
Glucose, Bld: 130 mg/dL — ABNORMAL HIGH (ref 65–99)
PHOSPHORUS: 3 mg/dL (ref 2.5–4.6)
Potassium: 3.2 mmol/L — ABNORMAL LOW (ref 3.5–5.1)
Sodium: 133 mmol/L — ABNORMAL LOW (ref 135–145)

## 2017-07-14 LAB — URINE CULTURE: Culture: NO GROWTH

## 2017-07-14 MED ORDER — POTASSIUM CHLORIDE CRYS ER 20 MEQ PO TBCR
30.0000 meq | EXTENDED_RELEASE_TABLET | Freq: Two times a day (BID) | ORAL | Status: DC
  Filled 2017-07-14: qty 1

## 2017-07-14 MED ORDER — POTASSIUM CHLORIDE CRYS ER 20 MEQ PO TBCR
40.0000 meq | EXTENDED_RELEASE_TABLET | Freq: Two times a day (BID) | ORAL | Status: DC
  Administered 2017-07-14 (×2): 40 meq via ORAL
  Filled 2017-07-14 (×2): qty 2

## 2017-07-14 NOTE — Consult Note (Signed)
Urology Consult  Referring physician: Dr. Wynetta Emery Reason for referral: hydronephrosis in solitary kidney  Chief Complaint: urinary retention  History of Present Illness: Ricky Blankenship is a 83yo with a hx of Left renal RCC s/p radical nephrectomy and bladder cancer several years ago who was admitted 3 days ago with fatigue. Creatinien was 1.26 on admission. He was found to be in urinary retention  On 11/18 and a foley catheter was placed. He then made 6.3L of urine during the next 24 hours. His NA, Cl, Ca have been low since admission. He underwent renal US which showed moderate hydronephrosis in his right kidney, absent left kidney. He was having urinary frequency, urgency and nocturia prior to foley placement. He had an episode of gross hematuria after the foley was placed but this has since resolved.  He was not on BPH medications prior to admission.  Pt denies any new bone pain. No history of prostate cancer CT stone study from today shows multiple lesions in the liver, spleen and left adrenal gland. There are numerous osseous lesions concerning for metastasis from bladder versus prostate. He also has thickening of his bladder and right ureter.  Past Medical History:  Diagnosis Date  . Abdominal aortic aneurysm (AAA) ([place])   . Bladder cancer ([place])   . Cancer of kidney excluding renal pelvis ([place])   . Diabetes mellitus without complication ([place])   . Hypertension   . PAF (paroxysmal atrial fibrillation) ([place])    Past Surgical History:  Procedure Laterality Date  . ABDOMINAL AORTIC ANEURYSM REPAIR    . ABDOMINAL SURGERY      Medications: I have reviewed the patient's current medications. Allergies:  Allergies  Allergen Reactions  . Morphine And Related     Levels drop too low    History reviewed. No pertinent family history. Social History:  reports that  has never smoked. he has never used smokeless tobacco. He reports that he does not drink alcohol or use drugs.  Review of Systems   Constitutional: Positive for malaise/fatigue.  Genitourinary: Positive for frequency, hematuria and urgency.  All other systems reviewed and are negative.   Physical Exam:  Vital signs in last 24 hours: Temp:  [98.5 F (36.9 C)-99.4 F (37.4 C)] 98.5 F (36.9 C) (11/19 1400) Pulse Rate:  [70-116] 70 (11/19 1400) Resp:  [18-20] 20 (11/19 1400) BP: (104-145)/(57-100) 104/57 (11/19 1400) SpO2:  [95 %-96 %] 96 % (11/19 1400) Physical Exam  Constitutional: He is oriented to person, place, and time. He appears well-developed and well-nourished.  HENT:  Head: Normocephalic and atraumatic.  Eyes: EOM are normal. Pupils are equal, round, and reactive to light.  Neck: Normal range of motion. No thyromegaly present.  Cardiovascular: Normal rate and regular rhythm.  Respiratory: Effort normal. No respiratory distress.  GI: Soft. He exhibits no distension and no mass. There is no tenderness. There is no rebound and no guarding. Hernia confirmed negative in the right inguinal area and confirmed negative in the left inguinal area.  Genitourinary: Rectum normal, testes normal and penis normal. Prostate is enlarged.  Genitourinary Comments: hard prostate  Musculoskeletal: Normal range of motion. He exhibits no edema.  Lymphadenopathy:       Right: No inguinal adenopathy present.       Left: No inguinal adenopathy present.  Neurological: He is alert and oriented to person, place, and time.  Skin: Skin is warm and dry.  Psychiatric: He has a normal mood and affect. His behavior is normal. Judgment and thought  content normal.    Laboratory Data:  Results for orders placed or performed during the hospital encounter of 07/11/17 (from the past 72 hour(s))  CBC with Differential     Status: Abnormal   Collection Time: 07/11/17  9:27 PM  Result Value Ref Range   WBC 14.1 (H) 4.0 - 10.5 K/uL   RBC 4.01 (L) 4.22 - 5.81 MIL/uL   Hemoglobin 13.2 13.0 - 17.0 g/dL   HCT 38.8 (L) 39.0 - 52.0 %   MCV  96.8 78.0 - 100.0 fL   MCH 32.9 26.0 - 34.0 pg   MCHC 34.0 30.0 - 36.0 g/dL   RDW 13.6 11.5 - 15.5 %   Platelets 177 150 - 400 K/uL   Neutrophils Relative % 82 %   Neutro Abs 11.7 (H) 1.7 - 7.7 K/uL   Lymphocytes Relative 7 %   Lymphs Abs 0.9 0.7 - 4.0 K/uL   Monocytes Relative 10 %   Monocytes Absolute 1.4 (H) 0.1 - 1.0 K/uL   Eosinophils Relative 1 %   Eosinophils Absolute 0.1 0.0 - 0.7 K/uL   Basophils Relative 0 %   Basophils Absolute 0.0 0.0 - 0.1 K/uL  Comprehensive metabolic panel     Status: Abnormal   Collection Time: 07/11/17  9:27 PM  Result Value Ref Range   Sodium 128 (L) 135 - 145 mmol/L   Potassium 3.9 3.5 - 5.1 mmol/L   Chloride 91 (L) 101 - 111 mmol/L   CO2 24 22 - 32 mmol/L   Glucose, Bld 133 (H) 65 - 99 mg/dL   BUN 28 (H) 6 - 20 mg/dL   Creatinine, Ser 1.26 (H) 0.61 - 1.24 mg/dL   Calcium 9.8 8.9 - 10.3 mg/dL   Total Protein 7.6 6.5 - 8.1 g/dL   Albumin 4.5 3.5 - 5.0 g/dL   AST 46 (H) 15 - 41 U/L   ALT 57 17 - 63 U/L   Alkaline Phosphatase 150 (H) 38 - 126 U/L   Total Bilirubin 1.6 (H) 0.3 - 1.2 mg/dL   GFR calc non Af Amer 51 (L) >60 mL/min   GFR calc Af Amer 59 (L) >60 mL/min    Comment: (NOTE) The eGFR has been calculated using the CKD EPI equation. This calculation has not been validated in all clinical situations. eGFR's persistently <60 mL/min signify possible Chronic Kidney Disease.    Anion gap 13 5 - 15  Troponin I     Status: None   Collection Time: 07/11/17  9:27 PM  Result Value Ref Range   Troponin I <0.03 <0.03 ng/mL  Urinalysis, Routine w reflex microscopic     Status: Abnormal   Collection Time: 07/11/17 10:40 PM  Result Value Ref Range   Color, Urine YELLOW YELLOW   APPearance CLOUDY (A) CLEAR   Specific Gravity, Urine 1.014 1.005 - 1.030   pH 5.0 5.0 - 8.0   Glucose, UA NEGATIVE NEGATIVE mg/dL   Hgb urine dipstick LARGE (A) NEGATIVE   Bilirubin Urine NEGATIVE NEGATIVE   Ketones, ur 5 (A) NEGATIVE mg/dL   Protein, ur 100 (A)  NEGATIVE mg/dL   Nitrite NEGATIVE NEGATIVE   Leukocytes, UA NEGATIVE NEGATIVE   RBC / HPF TOO NUMEROUS TO COUNT 0 - 5 RBC/hpf   WBC, UA 6-30 0 - 5 WBC/hpf   Bacteria, UA RARE (A) NONE SEEN   Squamous Epithelial / LPF 0-5 (A) NONE SEEN   Mucus PRESENT   CBC     Status: Abnormal   Collection  Time: 07/12/17  6:54 AM  Result Value Ref Range   WBC 14.3 (H) 4.0 - 10.5 K/uL   RBC 3.99 (L) 4.22 - 5.81 MIL/uL   Hemoglobin 13.2 13.0 - 17.0 g/dL   HCT 38.6 (L) 39.0 - 52.0 %   MCV 96.7 78.0 - 100.0 fL   MCH 33.1 26.0 - 34.0 pg   MCHC 34.2 30.0 - 36.0 g/dL   RDW 13.5 11.5 - 15.5 %   Platelets 178 150 - 400 K/uL  Basic metabolic panel     Status: Abnormal   Collection Time: 07/12/17  6:54 AM  Result Value Ref Range   Sodium 130 (L) 135 - 145 mmol/L   Potassium 3.7 3.5 - 5.1 mmol/L   Chloride 95 (L) 101 - 111 mmol/L   CO2 22 22 - 32 mmol/L   Glucose, Bld 117 (H) 65 - 99 mg/dL   BUN 28 (H) 6 - 20 mg/dL   Creatinine, Ser 1.17 0.61 - 1.24 mg/dL   Calcium 9.6 8.9 - 10.3 mg/dL   GFR calc non Af Amer 56 (L) >60 mL/min   GFR calc Af Amer >60 >60 mL/min    Comment: (NOTE) The eGFR has been calculated using the CKD EPI equation. This calculation has not been validated in all clinical situations. eGFR's persistently <60 mL/min signify possible Chronic Kidney Disease.    Anion gap 13 5 - 15  Magnesium     Status: Abnormal   Collection Time: 07/12/17  6:54 AM  Result Value Ref Range   Magnesium 2.5 (H) 1.7 - 2.4 mg/dL  Protime-INR     Status: None   Collection Time: 07/12/17  6:54 AM  Result Value Ref Range   Prothrombin Time 13.3 11.4 - 15.2 seconds   INR 1.02   APTT     Status: None   Collection Time: 07/12/17  6:54 AM  Result Value Ref Range   aPTT 29 24 - 36 seconds  Lactic acid, plasma     Status: None   Collection Time: 07/12/17  6:54 AM  Result Value Ref Range   Lactic Acid, Venous 1.0 0.5 - 1.9 mmol/L  Glucose, capillary     Status: Abnormal   Collection Time: 07/12/17  8:16 AM   Result Value Ref Range   Glucose-Capillary 113 (H) 65 - 99 mg/dL  Urine Culture     Status: None   Collection Time: 07/12/17  8:30 AM  Result Value Ref Range   Specimen Description URINE, CLEAN CATCH    Special Requests NONE    Culture      NO GROWTH Performed at [facility], [address], [place], [place] [address]    Report Status 07/14/2017 FINAL   Glucose, capillary     Status: Abnormal   Collection Time: 07/12/17 11:43 AM  Result Value Ref Range   Glucose-Capillary 184 (H) 65 - 99 mg/dL  Glucose, capillary     Status: Abnormal   Collection Time: 07/12/17  4:31 PM  Result Value Ref Range   Glucose-Capillary 109 (H) 65 - 99 mg/dL  CBC     Status: Abnormal   Collection Time: 07/13/17  6:47 AM  Result Value Ref Range   WBC 12.4 (H) 4.0 - 10.5 K/uL   RBC 3.72 (L) 4.22 - 5.81 MIL/uL   Hemoglobin 12.2 (L) 13.0 - 17.0 g/dL   HCT 35.6 (L) 39.0 - 52.0 %   MCV 95.7 78.0 - 100.0 fL   MCH 32.8 26.0 - 34.0  pg   MCHC 34.3 30.0 - 36.0 g/dL   RDW 13.2 11.5 - 15.5 %   Platelets 156 150 - 400 K/uL  Basic metabolic panel     Status: Abnormal   Collection Time: 07/13/17  6:47 AM  Result Value Ref Range   Sodium 131 (L) 135 - 145 mmol/L   Potassium 3.6 3.5 - 5.1 mmol/L   Chloride 98 (L) 101 - 111 mmol/L   CO2 21 (L) 22 - 32 mmol/L   Glucose, Bld 130 (H) 65 - 99 mg/dL   BUN 44 (H) 6 - 20 mg/dL   Creatinine, Ser 2.55 (H) 0.61 - 1.24 mg/dL    Comment: DELTA CHECK NOTED   Calcium 8.8 (L) 8.9 - 10.3 mg/dL   GFR calc non Af Amer 22 (L) >60 mL/min   GFR calc Af Amer 25 (L) >60 mL/min    Comment: (NOTE) The eGFR has been calculated using the CKD EPI equation. This calculation has not been validated in all clinical situations. eGFR's persistently <60 mL/min signify possible Chronic Kidney Disease.    Anion gap 12 5 - 15  TSH     Status: None   Collection Time: 07/13/17  6:47 AM  Result Value Ref Range   TSH 0.760 0.350 - 4.500 uIU/mL    Comment: Performed by a 3rd Generation  assay with a functional sensitivity of <=0.01 uIU/mL.  Renal function panel     Status: Abnormal   Collection Time: 07/14/17  6:09 AM  Result Value Ref Range   Sodium 133 (L) 135 - 145 mmol/L   Potassium 3.2 (L) 3.5 - 5.1 mmol/L   Chloride 98 (L) 101 - 111 mmol/L   CO2 23 22 - 32 mmol/L   Glucose, Bld 130 (H) 65 - 99 mg/dL   BUN 28 (H) 6 - 20 mg/dL   Creatinine, Ser 1.24 0.61 - 1.24 mg/dL    Comment: DELTA CHECK NOTED   Calcium 8.7 (L) 8.9 - 10.3 mg/dL   Phosphorus 3.0 2.5 - 4.6 mg/dL   Albumin 3.6 3.5 - 5.0 g/dL   GFR calc non Af Amer 52 (L) >60 mL/min   GFR calc Af Amer >60 >60 mL/min    Comment: (NOTE) The eGFR has been calculated using the CKD EPI equation. This calculation has not been validated in all clinical situations. eGFR's persistently <60 mL/min signify possible Chronic Kidney Disease.    Anion gap 12 5 - 15  CBC with Differential/Platelet     Status: Abnormal   Collection Time: 07/14/17  6:09 AM  Result Value Ref Range   WBC 10.7 (H) 4.0 - 10.5 K/uL   RBC 3.57 (L) 4.22 - 5.81 MIL/uL   Hemoglobin 11.9 (L) 13.0 - 17.0 g/dL   HCT 34.0 (L) 39.0 - 52.0 %   MCV 95.2 78.0 - 100.0 fL   MCH 33.3 26.0 - 34.0 pg   MCHC 35.0 30.0 - 36.0 g/dL   RDW 13.3 11.5 - 15.5 %   Platelets 160 150 - 400 K/uL   Neutrophils Relative % 77 %   Neutro Abs 8.3 (H) 1.7 - 7.7 K/uL   Lymphocytes Relative 10 %   Lymphs Abs 1.0 0.7 - 4.0 K/uL   Monocytes Relative 12 %   Monocytes Absolute 1.3 (H) 0.1 - 1.0 K/uL   Eosinophils Relative 1 %   Eosinophils Absolute 0.2 0.0 - 0.7 K/uL   Basophils Relative 0 %   Basophils Absolute 0.0 0.0 - 0.1 K/uL   Recent  Results (from the past 240 hour(s))  Urine Culture     Status: None   Collection Time: 07/12/17  8:30 AM  Result Value Ref Range Status   Specimen Description URINE, CLEAN CATCH  Final   Special Requests NONE  Final   Culture   Final    NO GROWTH Performed at [facility], [address] [place], [place] [address]    Report  Status 07/14/2017 FINAL  Final   Creatinine: Recent Labs    07/11/17 2127 07/12/17 0654 07/13/17 0647 07/14/17 0609  CREATININE 1.26* 1.17 2.55* 1.24   Baseline Creatinine: unknown  Impression/Assessment:  83yo with urinary retention, right hydronephrosis, osseous metastasis concerning for prostate versus bladder origin  Plan:  1. Urinary retention: Continue indwelling foley catheter. Please continue to monitor electrolytes due to this patients post obstructive diuresis. Please start flomax 0.'4mg'$  daily 2. Right hydronephrosis: I discussed the various causes of hydronephrosis witht he patient including chronic obstruction from bladder out obstruction, bladder cancer, prostate cancer, etc. We will continue to follow his hydronephrosis and if it worsens and hsi urine output decreases he will require ureteral stent versus nephrostomy tube placement.  3. Osseous metastasis: Please obtain PSA and biopsy of his osseous mets.  He will be scheduled for cystoscopy as an outpatient to rule out bladder tumor  Nicolette Bang 07/14/2017, 9:10 PM

## 2017-07-14 NOTE — Progress Notes (Addendum)
Subjective: Interval History: has no complaint of nausea or vomiting.  Patient also denies any difficulty breathing.  Presently he offers no complaints..  Objective: Vital signs in last 24 hours: Temp:  [98.2 F (36.8 C)-99.4 F (37.4 C)] 99.4 F (37.4 C) (11/19 0500) Pulse Rate:  [66-92] 92 (11/19 0500) Resp:  [18] 18 (11/19 0500) BP: (106-141)/(58-90) 141/90 (11/19 0500) SpO2:  [95 %-99 %] 95 % (11/19 0500) Weight change:   Intake/Output from previous day: 11/18 0701 - 11/19 0700 In: 3110 [P.O.:480; I.V.:2580; IV Piggyback:50] Out: 6300 [Urine:6300] Intake/Output this shift: No intake/output data recorded.  General appearance: alert, cooperative and no distress Resp: clear to auscultation bilaterally Cardio: regular rate and rhythm Extremities: No edema  Lab Results: Recent Labs    07/13/17 0647 07/14/17 0609  WBC 12.4* 10.7*  HGB 12.2* 11.9*  HCT 35.6* 34.0*  PLT 156 160   BMET:  Recent Labs    07/13/17 0647 07/14/17 0609  NA 131* 133*  K 3.6 3.2*  CL 98* 98*  CO2 21* 23  GLUCOSE 130* 130*  BUN 44* 28*  CREATININE 2.55* 1.24  CALCIUM 8.8* 8.7*   No results for input(s): PTH in the last 72 hours. Iron Studies: No results for input(s): IRON, TIBC, TRANSFERRIN, FERRITIN in the last 72 hours.  Studies/Results: US Renal  Result Date: 07/12/2017 CLINICAL DATA:  Sepsis, UTI.  Prior left nephrectomy EXAM: RENAL / URINARY TRACT ULTRASOUND COMPLETE COMPARISON:  None. FINDINGS: Right Kidney: Length: 13.0 cm. Moderate hydronephrosis. No visible mass. Normal echotexture. Left Kidney: Length: Prior left nephrectomy. Bladder: Appears normal for degree of bladder distention. IMPRESSION: Moderate right hydronephrosis. Prior left nephrectomy. Electronically Signed   By: Rolm Baptise M.D.   On: 07/12/2017 12:44    I have reviewed the patient's current medications.  Assessment/Plan: 1] acute kidney injury: Taught to be secondary to obstructive uropathy/AC  Einhibitor/prerenal.  Presently his renal function has improved to 1.24.Marland Kitchen  Patient had 6300 urine output  2] hypokalemia most likely secondary to renal loss 3] low CO2 possibly metabolic acidosis and has improved 4] history of renal and bladder CA 5] UTI 6] hematuria: At this moment etiology not clear.  Possibly from Foley catheter insertion.  However in a patient with renal and bladder CA recurrence of the disease need to be entertained. 7] history of CVA Plan: 1]We will continue his hydration and increase IV fluid to 135 cc/h 2] will check his renal panel and possibly repeat ultrasound of his kidneys to see resolution of the obstruction. 3] I agree with his potassium but will increase it to 40 mEq p.o. twice daily 4] will DC Lasix 5] we will check his renal panel in the morning  LOS: 3 days   Lajoyce Tamura S 07/14/2017,8:35 AM

## 2017-07-14 NOTE — Progress Notes (Signed)
PROGRESS NOTE    Ricky Blankenship  DUK:[id number]  DOB: [birth year]  DOA: 07/11/2017 PCP: Kendrick Ranch, MD   Brief Admission Hx: Ricky Blankenship is a 83 y.o. male with medical history significant of HTN, PAF not on anticoagulation, CAD, MI in 1986, AAA s/p surgical repair, mild dementia, RCC s/p nephrectomy, and bladder cancer; who presents with complaints of generalized weakness.  MDM/Assessment & Plan:   1. Sepsis secondary to UTI - resolved now, clinically much improved.  continue IV ceftriaxone and follow cultures. Continue supportive therapy and care.   2. Hematuria - likely secondary to UTI and also has history of treated bladder cancer.  He is s/p left nephrectomy.  He only has 1 kidney.  3. AKI - worsening renal function improved after foley was placed,      4. Moderate right renal hydronephrosis - Placed foley catheter, consult nephrology. Nephrology recommended urology consult. Repeat renal US to follow up on obstruction. 5. Generalized weakness and frequent falls - fall precautions, PT recommending SNF rehab.  6. Hyponatremia - from dehydration - treating with IVF hydration. Slowly improving.  7. Paroxysmal Atrial Fibrillation - continue coreg for rate control, no anticoagulation because of high fall risk. Echo normal EF grade 1 DD. TSH pending.  8. Leukocytosis - secondary to infection, follow and trend CBC. WBC trending down.  Now 10.7. 9. Essential Hypertension - stable, controlled, resumed all home meds. 10. History of renal cell carcinoma and bladder cancer - s/p left nephrectomy and chemotherapy, followed by Dr. Edwin Dada urologist.  11. Dementia -reported as mild, continue home aricept.  12. Depression - continue lexapro.    Echocardiogram 07/12/17 Impressions: - LVEF 60-65%, mild LVH, normal wall motion, grade 1 DD, normal LV   filling pressure, aortic valve sclerosis, trivial MR, normal LA   size, trivial TR, RVSP 30 mmHg, normal IVC.  DVT  prophylaxis: SCDs Code Status: Full  Family Communication: Disposition Plan: SNF  Subjective: Pt eating breakfast without complaints this morning     Objective: Vitals:   07/13/17 1300 07/13/17 2031 07/14/17 0500 07/14/17 0832  BP: (!) 106/58 110/64 (!) 141/90 (!) 145/100  Pulse: 66 66 92 (!) 116  Resp: 18 18 18    Temp: 98.2 F (36.8 C) 98.3 F (36.8 C) 99.4 F (37.4 C)   TempSrc:  Oral Oral   SpO2: 99% 98% 95%   Weight:      Height:        Intake/Output Summary (Last 24 hours) at 07/14/2017 1014 Last data filed at 07/14/2017 3762 Gross per 24 hour  Intake 3110 ml  Output 6300 ml  Net -3190 ml   Filed Weights   07/11/17 2044 07/12/17 0053  Weight: 97.5 kg (215 lb) 90.5 kg (199 lb 8.3 oz)   REVIEW OF SYSTEMS  As per history otherwise all reviewed and reported negative  Exam:  General exam: awake, alert, NAD, cooperative. Pleasant.  Respiratory system: Clear. No increased work of breathing. Cardiovascular system: S1 & S2 heard irregular.  Gastrointestinal system: Abdomen is soft and nontender. Normal bowel sounds heard. Central nervous system: Alert, oriented x 2. No focal neurological deficits. Extremities: no cyanosis or clubbing.   Data Reviewed: Basic Metabolic Panel: Recent Labs  Lab 07/11/17 2127 07/12/17 0654 07/13/17 0647 07/14/17 0609  NA 128* 130* 131* 133*  K 3.9 3.7 3.6 3.2*  CL 91* 95* 98* 98*  CO2 24 22 21* 23  GLUCOSE 133* 117* 130* 130*  BUN 28* 28* 44* 28*  CREATININE 1.26* 1.17 2.55* 1.24  CALCIUM 9.8 9.6 8.8* 8.7*  MG  --  2.5*  --   --   PHOS  --   --   --  3.0   Liver Function Tests: Recent Labs  Lab 07/11/17 2127 07/14/17 0609  AST 46*  --   ALT 57  --   ALKPHOS 150*  --   BILITOT 1.6*  --   PROT 7.6  --   ALBUMIN 4.5 3.6   No results for input(s): LIPASE, AMYLASE in the last 168 hours. No results for input(s): AMMONIA in the last 168 hours. CBC: Recent Labs  Lab 07/11/17 2127 07/12/17 0654 07/13/17 0647  07/14/17 0609  WBC 14.1* 14.3* 12.4* 10.7*  NEUTROABS 11.7*  --   --  8.3*  HGB 13.2 13.2 12.2* 11.9*  HCT 38.8* 38.6* 35.6* 34.0*  MCV 96.8 96.7 95.7 95.2  PLT 177 178 156 160   Cardiac Enzymes: Recent Labs  Lab 07/11/17 2127  TROPONINI <0.03   CBG (last 3)  Recent Labs    07/12/17 0816 07/12/17 1143 07/12/17 1631  GLUCAP 113* 184* 109*   No results found for this or any previous visit (from the past 240 hour(s)).   Studies: US Renal  Result Date: 07/12/2017 CLINICAL DATA:  Sepsis, UTI.  Prior left nephrectomy EXAM: RENAL / URINARY TRACT ULTRASOUND COMPLETE COMPARISON:  None. FINDINGS: Right Kidney: Length: 13.0 cm. Moderate hydronephrosis. No visible mass. Normal echotexture. Left Kidney: Length: Prior left nephrectomy. Bladder: Appears normal for degree of bladder distention. IMPRESSION: Moderate right hydronephrosis. Prior left nephrectomy. Electronically Signed   By: Rolm Baptise M.D.   On: 07/12/2017 12:44   Scheduled Meds: . amLODipine  10 mg Oral Daily  . aspirin EC  81 mg Oral Daily  . atorvastatin  20 mg Oral q1800  . carvedilol  12.5 mg Oral BID WC  . donepezil  5 mg Oral QHS  . escitalopram  10 mg Oral Daily  . feeding supplement (ENSURE ENLIVE)  237 mL Oral BID BM  . potassium chloride  40 mEq Oral BID   Continuous Infusions: . sodium chloride 135 mL/hr at 07/14/17 0912  . cefTRIAXone (ROCEPHIN)  IV Stopped (07/13/17 2229)    Principal Problem:   Sepsis secondary to UTI [facility]) Active Problems:   Generalized weakness   Dehydration   AF (paroxysmal atrial fibrillation) (HCC)   Essential hypertension   History of renal cell carcinoma   Depression   Hyponatremia   Gross hematuria  Time spent:   Irwin Brakeman, MD, FAAFP [facility] Pager [phone]  If 7PM-7AM, please contact night-coverage [email or web address] Password [email or web address] 07/14/2017, 10:14 AM    LOS: 3 days

## 2017-07-15 ENCOUNTER — Inpatient Hospital Stay (HOSPITAL_COMMUNITY): Payer: Medicare Other

## 2017-07-15 LAB — RENAL FUNCTION PANEL
ANION GAP: 10 (ref 5–15)
Albumin: 3.6 g/dL (ref 3.5–5.0)
BUN: 24 mg/dL — ABNORMAL HIGH (ref 6–20)
CHLORIDE: 99 mmol/L — AB (ref 101–111)
CO2: 24 mmol/L (ref 22–32)
CREATININE: 1.05 mg/dL (ref 0.61–1.24)
Calcium: 8.9 mg/dL (ref 8.9–10.3)
Glucose, Bld: 133 mg/dL — ABNORMAL HIGH (ref 65–99)
POTASSIUM: 4 mmol/L (ref 3.5–5.1)
Phosphorus: 2.5 mg/dL (ref 2.5–4.6)
Sodium: 133 mmol/L — ABNORMAL LOW (ref 135–145)

## 2017-07-15 LAB — CBC WITH DIFFERENTIAL/PLATELET
Basophils Absolute: 0 10*3/uL (ref 0.0–0.1)
Basophils Relative: 0 %
EOS ABS: 0.2 10*3/uL (ref 0.0–0.7)
Eosinophils Relative: 2 %
HEMATOCRIT: 35.7 % — AB (ref 39.0–52.0)
HEMOGLOBIN: 12.2 g/dL — AB (ref 13.0–17.0)
Lymphocytes Relative: 13 %
Lymphs Abs: 1.4 10*3/uL (ref 0.7–4.0)
MCH: 33.2 pg (ref 26.0–34.0)
MCHC: 34.2 g/dL (ref 30.0–36.0)
MCV: 97.3 fL (ref 78.0–100.0)
Monocytes Absolute: 1.3 10*3/uL — ABNORMAL HIGH (ref 0.1–1.0)
Monocytes Relative: 12 %
NEUTROS ABS: 7.4 10*3/uL (ref 1.7–7.7)
NEUTROS PCT: 73 %
Platelets: 159 10*3/uL (ref 150–400)
RBC: 3.67 MIL/uL — AB (ref 4.22–5.81)
RDW: 13.8 % (ref 11.5–15.5)
WBC: 10.2 10*3/uL (ref 4.0–10.5)

## 2017-07-15 LAB — PSA: PROSTATIC SPECIFIC ANTIGEN: 0.57 ng/mL (ref 0.00–4.00)

## 2017-07-15 LAB — GLUCOSE, CAPILLARY: Glucose-Capillary: 149 mg/dL — ABNORMAL HIGH (ref 65–99)

## 2017-07-15 MED ORDER — IOPAMIDOL (ISOVUE-300) INJECTION 61%
INTRAVENOUS | Status: AC
  Administered 2017-07-15: 100 mL
  Filled 2017-07-15: qty 30

## 2017-07-15 MED ORDER — IOPAMIDOL (ISOVUE-300) INJECTION 61%: 100.0000 mL | Freq: Once | INTRAVENOUS | Status: DC | PRN

## 2017-07-15 MED ORDER — TAMSULOSIN HCL 0.4 MG PO CAPS
0.4000 mg | ORAL_CAPSULE | Freq: Every day | ORAL | Status: DC
  Administered 2017-07-15 – 2017-07-18 (×4): 0.4 mg via ORAL
  Filled 2017-07-15 (×4): qty 1

## 2017-07-15 NOTE — Progress Notes (Addendum)
Subjective: Interval History: Patient is feeling much better.  His appetite is good and he does not have any nausea or vomiting.  Objective: Vital signs in last 24 hours: Temp:  [98.3 F (36.8 C)-98.5 F (36.9 C)] 98.5 F (36.9 C) (11/20 0542) Pulse Rate:  [66-116] 66 (11/20 0542) Resp:  [20] 20 (11/20 0542) BP: (104-145)/(57-100) 120/71 (11/20 0542) SpO2:  [96 %-98 %] 98 % (11/20 0542) Weight change:   Intake/Output from previous day: 11/19 0701 - 11/20 0700 In: 1775 [P.O.:240; I.V.:1485; IV Piggyback:50] Out: 4401 [Urine:3275] Intake/Output this shift: No intake/output data recorded.  General appearance: alert, cooperative and no distress Resp: clear to auscultation bilaterally Cardio: regular rate and rhythm Extremities: No edema  Lab Results: Recent Labs    07/14/17 0609 07/15/17 0433  WBC 10.7* 10.2  HGB 11.9* 12.2*  HCT 34.0* 35.7*  PLT 160 159   BMET:  Recent Labs    07/14/17 0609 07/15/17 0433  NA 133* 133*  K 3.2* 4.0  CL 98* 99*  CO2 23 24  GLUCOSE 130* 133*  BUN 28* 24*  CREATININE 1.24 1.05  CALCIUM 8.7* 8.9   No results for input(s): PTH in the last 72 hours. Iron Studies: No results for input(s): IRON, TIBC, TRANSFERRIN, FERRITIN in the last 72 hours.  Studies/Results: Ct Renal Stone Study  Result Date: 07/14/2017 CLINICAL DATA:  Hydronephrosis. Sepsis. Urinary tract infection. History of renal cell carcinoma and left nephrectomy. EXAM: CT ABDOMEN AND PELVIS WITHOUT CONTRAST TECHNIQUE: Multidetector CT imaging of the abdomen and pelvis was performed following the standard protocol without IV contrast. COMPARISON:  Renal ultrasound 07/12/2017 FINDINGS: Lower chest: Minimal atelectasis in the lung bases. No pleural effusion. Extensive coronary artery atherosclerosis. Hepatobiliary: Numerous small hypodense lesions in the liver measuring up to 1.5 cm, incompletely characterized without IV contrast though with the larger lesions not clearly having  attenuation of cysts. Unremarkable gallbladder. No significant biliary dilatation. Pancreas: Small calcifications scattered throughout the pancreas which may reflect the sequelae of chronic pancreatitis. No evidence of significant acute peripancreatic inflammation or ductal dilatation. Spleen: Punctate calcifications in the spleen as well as a poorly defined 8 mm low-density lesion medially. Adrenals/Urinary Tract: 1.7 cm left adrenal nodule (attenuation of 20 HU). Unremarkable right adrenal gland. Prior left nephrectomy. Peripherally calcified retroperitoneal collection extending from the left renal fossa inferiorly over a length of 10 cm, likely old hematoma. 1.5 cm hypodense lesion in the medial interpolar right kidney, likely a cyst. Mild right hydronephrosis. At most minimal dilatation or wall thickening of the proximal right ureter, decompressed distally. Foley catheter in place with decompressed bladder. No urinary tract calculi identified. Stomach/Bowel: Small sliding hiatal hernia. No evidence of bowel obstruction. Prominent sigmoid colon diverticulosis with mild surrounding fat stranding. Unremarkable appendix. Vascular/Lymphatic: Atherosclerosis of the abdominal aorta and its major branch vessels. Prior aortic aneurysm repair, with the infrarenal abdominal aorta measuring up to 3.8 cm in diameter. No enlarged lymph nodes. Reproductive: Unremarkable prostate. Symmetric mild nodularity of the seminal vesicles. Other: No intraperitoneal free fluid. Postsurgical changes in the midline anterior abdominal wall. Musculoskeletal: Multiple small lucent osseous lesions in the pelvis, including a 1.9 cm lesion in the posterior left ilium. 2.7 cm lytic lesion with cortical disruption in the right aspect of the L5 vertebral body. Scattered small sclerotic spine lesions as well. Mild T11 compression fracture with inferior endplate Schmorl's node deformity, likely chronic with a bridging osteophyte noted across the  anterior disc space. IMPRESSION: 1. Mild right hydronephrosis without obstructing stone  identified. Upper tract infection is possible. 2. Sigmoid colon diverticulosis with mild surrounding stranding, query mild acute diverticulitis. 3. Prior left nephrectomy. Calcified collection in the left retroperitoneum likely reflects an old hematoma. 4. Multiple osseous lesions, including a 2.7 cm lytic lesion in the L5 vertebral body concerning for metastases. 5. Indeterminate lesions in the liver, spleen, and left adrenal gland. Suggest correlation with prior outside imaging or nonemergent evaluation with contrast-enhanced abdominal MRI (preferably as an outpatient when the patient is able to follow directions and breath hold). 6. Aortic Atherosclerosis (ICD10-I70.0). Prior abdominal aortic aneurysm repair. Electronically Signed   By: Logan Bores M.D.   On: 07/14/2017 12:42    I have reviewed the patient's current medications.  Assessment/Plan: Problem #1 acute kidney injury: Possibly secondary to obstructive uropathy/AC Einhibitor/prerenal.  Presently his renal function has recovered.  His creatinine is 1.05...  Patient had 3200 urine output ultrasound of the kidney showed mild hydronephrosis. 2] hypokalemia: His potassium is normal 3] low CO2 possibly metabolic has improved 4] history of renal and bladder CA 5] UTI: Patient is afebrile with normal white blood cell count. 6] hematuria: At this moment etiology not clear.  Possibly from Foley catheter insertion.   7] history of CVA Plan: 1]We will decrease IV fluid to 75 cc/h 2] will remove Foley catheter today.  If he has recurrent obstructive uropathy patient may need to be sent his Foley catheter until he is seen by urology. 3] D/C Kcl 4] we will check his renal panel in the morning  LOS: 4 days   Jasmin Winberry S 07/15/2017,8:22 AM

## 2017-07-15 NOTE — Progress Notes (Signed)
PROGRESS NOTE    Ricky Blankenship  IRW:[id number]  DOB: Sep 02, 1931  DOA: 07/11/2017 PCP: Kendrick Ranch, MD  Brief Admission Hx: Ricky Blankenship is a 83 y.o. male with medical history significant of HTN, PAF not on anticoagulation, CAD, MI in 1986, AAA s/p surgical repair, mild dementia, RCC s/p nephrectomy, and bladder cancer; who presents with complaints of generalized weakness.  MDM/Assessment & Plan:   1. Sepsis secondary to UTI - resolved now, clinically much improved.  continue IV ceftriaxone and follow cultures. Continue supportive therapy and care.   2. Hematuria - likely secondary to UTI and also has history of treated bladder cancer.  He is s/p left nephrectomy.  He only has 1 kidney.  3. AKI - postobstructive -- worsening renal function improved after foley was placed.  Monitor electrolytes.   4. Urinary retention - foley was placed with immediate relief, started flomax 0.4 mg daily.      5. Moderate right renal hydronephrosis - Placed foley catheter, consulted nephrology and urology. He got immediate relief after foley was placed.  Nephrology removed foley today for void trial.  Urology ordered CT stone study. 6. Osseous metastases - ordered PSA, consult to IR for biopsy per urology recs.  7. Generalized weakness and frequent falls - fall precautions, PT recommending SNF rehab.  8. Hyponatremia - from dehydration - treating with IVF hydration. Slowly improving.  9. Paroxysmal Atrial Fibrillation - continue coreg for rate control, no anticoagulation because of high fall risk. Echo normal EF grade 1 DD. TSH WNL.  10. Leukocytosis - secondary to infection, follow and trend CBC. WBC trending down.  Now 10.7. 11. Essential Hypertension - stable, controlled, resumed all home meds. 12. History of renal cell carcinoma and bladder cancer - s/p left nephrectomy and chemotherapy, followed by Dr. Edwin Dada urologist.  It is looking like he has liver and bony mets now.     13. Dementia -reported as mild, continue home aricept.  14. Depression - continue lexapro.    Echocardiogram 07/12/17 Impressions: - LVEF 60-65%, mild LVH, normal wall motion, grade 1 DD, normal LV   filling pressure, aortic valve sclerosis, trivial MR, normal LA   size, trivial TR, RVSP 30 mmHg, normal IVC.  DVT prophylaxis: SCDs Code Status: Full  Family Communication: Disposition Plan: SNF  Subjective: Pt says he feels better than he has in a long time.       Objective: Vitals:   07/14/17 0832 07/14/17 1400 07/14/17 2128 07/15/17 0542  BP: (!) 145/100 (!) 104/57 123/86 120/71  Pulse: (!) 116 70 72 66  Resp:  20 20 20   Temp:  98.5 F (36.9 C) 98.3 F (36.8 C) 98.5 F (36.9 C)  TempSrc:  Oral Oral Oral  SpO2:  96% 97% 98%  Weight:      Height:        Intake/Output Summary (Last 24 hours) at 07/15/2017 7619 Last data filed at 07/15/2017 0844 Gross per 24 hour  Intake 1535 ml  Output 3975 ml  Net -2440 ml   Filed Weights   07/11/17 2044 07/12/17 0053  Weight: 97.5 kg (215 lb) 90.5 kg (199 lb 8.3 oz)   REVIEW OF SYSTEMS  As per history otherwise all reviewed and reported negative  Exam:  General exam: awake, alert, NAD, cooperative. Pleasant.  Respiratory system: Clear. No increased work of breathing. Cardiovascular system: S1 & S2 heard irregular.  Gastrointestinal system: Abdomen is soft and nontender. Normal bowel sounds heard. Central nervous system: Alert,  oriented x 2. No focal neurological deficits. Extremities: no cyanosis or clubbing.   Data Reviewed: Basic Metabolic Panel: Recent Labs  Lab 07/11/17 2127 07/12/17 0654 07/13/17 0647 07/14/17 0609 07/15/17 0433  NA 128* 130* 131* 133* 133*  K 3.9 3.7 3.6 3.2* 4.0  CL 91* 95* 98* 98* 99*  CO2 24 22 21* 23 24  GLUCOSE 133* 117* 130* 130* 133*  BUN 28* 28* 44* 28* 24*  CREATININE 1.26* 1.17 2.55* 1.24 1.05  CALCIUM 9.8 9.6 8.8* 8.7* 8.9  MG  --  2.5*  --   --   --   PHOS  --   --   --   3.0 2.5   Liver Function Tests: Recent Labs  Lab 07/11/17 2127 07/14/17 0609 07/15/17 0433  AST 46*  --   --   ALT 57  --   --   ALKPHOS 150*  --   --   BILITOT 1.6*  --   --   PROT 7.6  --   --   ALBUMIN 4.5 3.6 3.6   No results for input(s): LIPASE, AMYLASE in the last 168 hours. No results for input(s): AMMONIA in the last 168 hours. CBC: Recent Labs  Lab 07/11/17 2127 07/12/17 0654 07/13/17 0647 07/14/17 0609 07/15/17 0433  WBC 14.1* 14.3* 12.4* 10.7* 10.2  NEUTROABS 11.7*  --   --  8.3* 7.4  HGB 13.2 13.2 12.2* 11.9* 12.2*  HCT 38.8* 38.6* 35.6* 34.0* 35.7*  MCV 96.8 96.7 95.7 95.2 97.3  PLT 177 178 156 160 159   Cardiac Enzymes: Recent Labs  Lab 07/11/17 2127  TROPONINI <0.03   CBG (last 3)  Recent Labs    07/12/17 1143 07/12/17 1631  GLUCAP 184* 109*   Recent Results (from the past 240 hour(s))  Urine Culture     Status: None   Collection Time: 07/12/17  8:30 AM  Result Value Ref Range Status   Specimen Description URINE, CLEAN CATCH  Final   Special Requests NONE  Final   Culture   Final    NO GROWTH Performed at [facility], [place] [address] [place], [place] [address]    Report Status 07/14/2017 FINAL  Final     Studies: Ct Renal Stone Study  Result Date: 07/14/2017 CLINICAL DATA:  Hydronephrosis. Sepsis. Urinary tract infection. History of renal cell carcinoma and left nephrectomy. EXAM: CT ABDOMEN AND PELVIS WITHOUT CONTRAST TECHNIQUE: Multidetector CT imaging of the abdomen and pelvis was performed following the standard protocol without IV contrast. COMPARISON:  Renal ultrasound 07/12/2017 FINDINGS: Lower chest: Minimal atelectasis in the lung bases. No pleural effusion. Extensive coronary artery atherosclerosis. Hepatobiliary: Numerous small hypodense lesions in the liver measuring up to 1.5 cm, incompletely characterized without IV contrast though with the larger lesions not clearly having attenuation of cysts. Unremarkable  gallbladder. No significant biliary dilatation. Pancreas: Small calcifications scattered throughout the pancreas which may reflect the sequelae of chronic pancreatitis. No evidence of significant acute peripancreatic inflammation or ductal dilatation. Spleen: Punctate calcifications in the spleen as well as a poorly defined 8 mm low-density lesion medially. Adrenals/Urinary Tract: 1.7 cm left adrenal nodule (attenuation of 20 HU). Unremarkable right adrenal gland. Prior left nephrectomy. Peripherally calcified retroperitoneal collection extending from the left renal fossa inferiorly over a length of 10 cm, likely old hematoma. 1.5 cm hypodense lesion in the medial interpolar right kidney, likely a cyst. Mild right hydronephrosis. At most minimal dilatation or wall thickening of the proximal right ureter, decompressed  distally. Foley catheter in place with decompressed bladder. No urinary tract calculi identified. Stomach/Bowel: Small sliding hiatal hernia. No evidence of bowel obstruction. Prominent sigmoid colon diverticulosis with mild surrounding fat stranding. Unremarkable appendix. Vascular/Lymphatic: Atherosclerosis of the abdominal aorta and its major branch vessels. Prior aortic aneurysm repair, with the infrarenal abdominal aorta measuring up to 3.8 cm in diameter. No enlarged lymph nodes. Reproductive: Unremarkable prostate. Symmetric mild nodularity of the seminal vesicles. Other: No intraperitoneal free fluid. Postsurgical changes in the midline anterior abdominal wall. Musculoskeletal: Multiple small lucent osseous lesions in the pelvis, including a 1.9 cm lesion in the posterior left ilium. 2.7 cm lytic lesion with cortical disruption in the right aspect of the L5 vertebral body. Scattered small sclerotic spine lesions as well. Mild T11 compression fracture with inferior endplate Schmorl's node deformity, likely chronic with a bridging osteophyte noted across the anterior disc space. IMPRESSION: 1.  Mild right hydronephrosis without obstructing stone identified. Upper tract infection is possible. 2. Sigmoid colon diverticulosis with mild surrounding stranding, query mild acute diverticulitis. 3. Prior left nephrectomy. Calcified collection in the left retroperitoneum likely reflects an old hematoma. 4. Multiple osseous lesions, including a 2.7 cm lytic lesion in the L5 vertebral body concerning for metastases. 5. Indeterminate lesions in the liver, spleen, and left adrenal gland. Suggest correlation with prior outside imaging or nonemergent evaluation with contrast-enhanced abdominal MRI (preferably as an outpatient when the patient is able to follow directions and breath hold). 6. Aortic Atherosclerosis (ICD10-I70.0). Prior abdominal aortic aneurysm repair. Electronically Signed   By: Logan Bores M.D.   On: 07/14/2017 12:42   Scheduled Meds: . amLODipine  10 mg Oral Daily  . aspirin EC  81 mg Oral Daily  . atorvastatin  20 mg Oral q1800  . carvedilol  12.5 mg Oral BID WC  . donepezil  5 mg Oral QHS  . escitalopram  10 mg Oral Daily  . feeding supplement (ENSURE ENLIVE)  237 mL Oral BID BM  . tamsulosin  0.4 mg Oral QPC breakfast   Continuous Infusions: . sodium chloride 75 mL/hr at 07/15/17 0840  . cefTRIAXone (ROCEPHIN)  IV 1 g (07/14/17 2230)    Principal Problem:   Sepsis secondary to UTI [facility]) Active Problems:   Generalized weakness   Dehydration   AF (paroxysmal atrial fibrillation) (HCC)   Essential hypertension   History of renal cell carcinoma   Depression   Hyponatremia   Gross hematuria  Time spent:   Irwin Brakeman, MD, FAAFP [facility] Pager [phone]  If 7PM-7AM, please contact night-coverage [email or web address] Password [email or web address] 07/15/2017, 9:18 AM    LOS: 4 days

## 2017-07-15 NOTE — Progress Notes (Addendum)
Physical Therapy Treatment Patient Details Name: Ricky Blankenship MRN: [id number] DOB: 18-Dec-1931 Today's Date: 07/15/2017    History of Present IllnessLeonard B Woolbright is a 83 y.o. male with medical history significant of HTN, PAF not on anticoagulation, CAD, MI in 1986, AAA s/p surgical repair, mild dementia, RCC s/p nephrectomy, and bladder cancer; who presents with complaints of generalized weakness. Patient and his daughter to provide history.  All of the patient's doctors are in [place], [place].  At baseline the patient lives at home with his wife and is her primary caregiver. Symptoms appear to have been progressively worsening over the last 4 weeks after patient had been diagnosed with bronchitis.  He notes that he may have been given antibiotics and took them as directed.  He has had generalized weakness and has gotten to the point in which he is unable to walk over the last week.  Previously falling once or twice per week now every day.  He reports his legs just not being able to hold him up.  Associated symptoms include incontinence of urine/bowel, chills, poor oral intake, and reports of new tremors.  Denies any recent changes in medications or chest pain. He was seen in the emergency department in [place] 5 days ago, but noted to have low sodium levels and discharged home.  His PCP had come to see him today and recommended him to come to the hospital for further evaluation.  The patient is followed by Dr. Edwin Dada of urology and reportedly has been routinely followed up.  Patient last received chemo sometime this summer for recurrence of bladder cancer, but reportedly has been cancer free at this time.  Patient previously was noted to to be in atrial fibrillation, but reports that he was evaluated by a cardiologist but never started on blood thinners.      PT Comments    Pt states that he is not feeling as well as yesterday.  Pt unable to stand today.  He will continue to benefit  from skilled physical therapy services to maximize functional ability.   Follow Up Recommendations  SNF     Equipment Recommendations  Rolling walker with 5" wheels    Recommendations for Other Services  OT     Precautions / Restrictions      Mobility  Bed Mobility Overal bed mobility: Needs Assistance Bed Mobility: Rolling;Supine to Sit;Sit to Supine Rolling: Mod assist Sidelying to sit: Max assist Supine to sit: Max assist Sit to supine: Max assist Sit to sidelying: Max assist General bed mobility comments: sat edge of bed x 2 minutes then was needed to lie down due to being light headed           Exercises General Exercises - Lower Extremity Ankle Circles/Pumps: AROM;Both;10 reps Quad Sets: Both;10 reps Gluteal Sets: Both;10 reps Heel Slides: Both;10 reps Straight Leg Raises: Both;10 reps        Pertinent Vitals/Pain  none       Prior Function            PT Goals (current goals can now be found in the care plan section) Acute Rehab PT Goals PT Goal Formulation: With patient Potential to Achieve Goals: Fair Progress towards PT goals: Not progressing toward goals - comment    Frequency    Min 3X/week      PT Plan Current plan remains appropriate          End of Session   Activity Tolerance: Patient limited by fatigue Patient  left: in bed;with call bell/phone within reach Nurse Communication: Mobility status PT Visit Diagnosis: Unsteadiness on feet (R26.81);Muscle weakness (generalized) (M62.81);Other symptoms and signs involving the nervous system (R29.898)     Time: 5465-6812 PT Time Calculation (min) (ACUTE ONLY): 33 min  Charges:  $Therapeutic Exercise: 8-22 mins $Therapeutic Activity: 8-22 mins                         Rayetta Humphrey, PT CLT [phone] 07/15/2017, 4:17 PM

## 2017-07-15 NOTE — Clinical Social Work Note (Signed)
Clinical Social Work Assessment  Patient Details  Name: Ricky Blankenship MRN: [id number] Date of Birth: 1932/01/20  Date of referral:  07/15/17               Reason for consult:  Facility Placement                Permission sought to share information with:    Permission granted to share information::     Name::        Agency::     Relationship::     Contact Information:  daughter, Ricky Blankenship  Housing/Transportation Living arrangements for the past 2 months:  [place] of Information:  Adult Children Patient Interpreter Needed:  None Criminal Activity/Legal Involvement Pertinent to Current Situation/Hospitalization:    Significant Relationships:  Adult Children, Spouse Lives with:  Spouse Do you feel safe going back to the place where you live?  Yes Need for family participation in patient care:  Yes (Comment)  Care giving concerns:  None identified at baseline.   Social Worker assessment / plan:  Patient's daughter, Ricky Blankenship, advised that patient lives with his wife and that she is their only child and lives two hours away. She stated that at baseline patient is independent in all areas (drives car, drives tractor, mows lawn and cares for wife). She advised that over the past six weeks he has become increasingly weak. She stated that they are interested in [employer], [employer] and [employer] [facility] [place].   Employment status:  Retired Forensic scientist:  Medicare PT Recommendations:  [place] / Referral to community resources:  [place]  Patient/Family's Response to care:  Family is agreeable to short term SNF.   Patient/Family's Understanding of and Emotional Response to Diagnosis, Current Treatment, and Prognosis:  Family understands patient's diagnosis, treatment and prognosis.   Emotional Assessment Appearance:  Appears stated age Attitude/Demeanor/Rapport:    Affect (typically observed):   Unable to Assess Orientation:  Oriented to Self, Oriented to Place, Oriented to  Time, Oriented to Situation Alcohol / Substance use:  Not Applicable Psych involvement (Current and /or in the community):  No (Comment)  Discharge Needs  Concerns to be addressed:  Discharge Planning Concerns Readmission within the last 30 days:  No Current discharge risk:  None Barriers to Discharge:  No Barriers Identified   Ihor Gully, LCSW 07/15/2017, 4:14 PM

## 2017-07-15 NOTE — Progress Notes (Signed)
Pharmacy Antibiotic Note  Ricky Blankenship is a 83 y.o. male admitted on 07/11/2017 with UTI.  Pharmacy has been consulted for Ceftriaxone dosing. UC negative.  Patient improving  Plan: Ceftriaxone 1gm IV q24h Pharmacy will sign off  Height: 5\' 8"  (172.7 cm) Weight: 199 lb 8.3 oz (90.5 kg) IBW/kg (Calculated) : 68.4  Temp (24hrs), Avg:98.4 F (36.9 C), Min:98.3 F (36.8 C), Max:98.5 F (36.9 C)  Recent Labs  Lab 07/11/17 2127 07/12/17 0654 07/13/17 0647 07/14/17 0609 07/15/17 0433  WBC 14.1* 14.3* 12.4* 10.7* 10.2  CREATININE 1.26* 1.17 2.55* 1.24 1.05  LATICACIDVEN  --  1.0  --   --   --     Estimated Creatinine Clearance: 58.2 mL/min (by C-G formula based on SCr of 1.05 mg/dL).    Allergies  Allergen Reactions  . Morphine And Related     Levels drop too low    Antimicrobials this admission: Ceftriaxone 11/16>>   Microbiology results: 11/16 UCx: neg  Thank you for allowing pharmacy to be a part of this patient's care.  Excell Seltzer, PharmD Clinical Pharmacist 07/15/2017 11:54 AM

## 2017-07-15 NOTE — NC FL2 (Signed)
[place] LEVEL OF CARE SCREENING TOOL     IDENTIFICATION  Patient Name: Ricky Blankenship Birthdate: 1932/03/29 Sex: male Admission Date (Current Location): 07/11/2017  [facility] [place] Number:  [employer] and Address:  [facility] [address], [place]      Provider Number: [id number]  Attending Physician Name and Address:  Murlean Iba, MD  Relative Name and Phone Number:  Rennie Rouch  [phone]    Current Level of Care: Hospital Recommended Level of Care: [place] Prior Approval Number:    Date Approved/Denied:   PASRR Number:    Discharge Plan: SNF    Current Diagnoses: Patient Active Problem List   Diagnosis Date Noted  . Generalized weakness 07/12/2017  . Dehydration 07/12/2017  . AF (paroxysmal atrial fibrillation) ([place]) 07/12/2017  . Essential hypertension 07/12/2017  . History of renal cell carcinoma 07/12/2017  . Depression 07/12/2017  . Hyponatremia 07/12/2017  . Gross hematuria 07/12/2017  . Sepsis secondary to UTI ([place]) 07/11/2017    Orientation RESPIRATION BLADDER Height & Weight     Self, Time, Situation, Place  Normal Incontinent Weight: 199 lb 8.3 oz (90.5 kg) Height:  5\' 8"  (172.7 cm)  BEHAVIORAL SYMPTOMS/MOOD NEUROLOGICAL BOWEL NUTRITION STATUS      Continent Diet(Heart Healthy)  AMBULATORY STATUS COMMUNICATION OF NEEDS Skin   Extensive Assist Verbally Normal                       Personal Care Assistance Level of Assistance  Bathing, Feeding, Dressing Bathing Assistance: Limited assistance Feeding assistance: Limited assistance Dressing Assistance: Maximum assistance     Functional Limitations Info             SPECIAL CARE FACTORS FREQUENCY  PT (By licensed PT)     PT Frequency: 5 times a week              Contractures Contractures Info: Not present    Additional Factors Info  Code Status, Allergies, Psychotropic Code Status  Info: Full Allergies Info: Morphine and related Psychotropic Info: Lexapro         Current Medications (07/15/2017):  This is the current hospital active medication list Current Facility-Administered Medications  Medication Dose Route Frequency Provider Last Rate Last Dose  . 0.9 %  sodium chloride infusion   Intravenous Continuous Fran Lowes, MD 75 mL/hr at 07/15/17 0840    . acetaminophen (TYLENOL) tablet 650 mg  650 mg Oral Q6H PRN Fuller Plan A, MD   650 mg at 07/14/17 0908   Or  . acetaminophen (TYLENOL) suppository 650 mg  650 mg Rectal Q6H PRN Tamala Julian, Rondell A, MD      . albuterol (PROVENTIL) (2.5 MG/3ML) 0.083% nebulizer solution 2.5 mg  2.5 mg Nebulization Q6H PRN Smith, Rondell A, MD      . amLODipine (NORVASC) tablet 10 mg  10 mg Oral Daily Fuller Plan A, MD   10 mg at 07/15/17 0839  . aspirin EC tablet 81 mg  81 mg Oral Daily Fuller Plan A, MD   81 mg at 07/15/17 0839  . atorvastatin (LIPITOR) tablet 20 mg  20 mg Oral q1800 Fuller Plan A, MD   20 mg at 07/14/17 1649  . carvedilol (COREG) tablet 12.5 mg  12.5 mg Oral BID WC Smith, Rondell A, MD   12.5 mg at 07/15/17 0839  . cefTRIAXone (ROCEPHIN) 1 g in dextrose 5 % 50 mL IVPB  1 g Intravenous Q24H Johnson, Clanford L, MD 100 mL/hr at 07/14/17 2230 1 g at 07/14/17 2230  . docusate sodium (COLACE) capsule 100 mg  100 mg Oral Daily PRN Tamala Julian, Rondell A, MD      . donepezil (ARICEPT) tablet 5 mg  5 mg Oral QHS Smith, Rondell A, MD   5 mg at 07/14/17 2230  . escitalopram (LEXAPRO) tablet 10 mg  10 mg Oral Daily Fuller Plan A, MD   10 mg at 07/15/17 0839  . feeding supplement (ENSURE ENLIVE) (ENSURE ENLIVE) liquid 237 mL  237 mL Oral BID BM Smith, Rondell A, MD   237 mL at 07/15/17 0838  . ondansetron (ZOFRAN) tablet 4 mg  4 mg Oral Q6H PRN Fuller Plan A, MD       Or  . ondansetron (ZOFRAN) injection 4 mg  4 mg Intravenous Q6H PRN Smith, Rondell A, MD      . tamsulosin (FLOMAX) capsule 0.4 mg  0.4 mg Oral  QPC breakfast Wynetta Emery, Clanford L, MD   0.4 mg at 07/15/17 9379     Discharge Medications: Please see discharge summary for a list of discharge medications.  Relevant Imaging Results:  Relevant Lab Results:   Additional Information SSN: [place], [place]

## 2017-07-15 NOTE — Progress Notes (Signed)
Patient ID: Ricky Blankenship, male   DOB: 1932-02-13, 83 y.o.   MRN: [id number]   Request for bone lesion biopsy  IMPRESSION: 1. Mild right hydronephrosis without obstructing stone identified. Upper tract infection is possible. 2. Sigmoid colon diverticulosis with mild surrounding stranding, query mild acute diverticulitis. 3. Prior left nephrectomy. Calcified collection in the left retroperitoneum likely reflects an old hematoma. 4. Multiple osseous lesions, including a 2.7 cm lytic lesion in the L5 vertebral body concerning for metastases. 5. Indeterminate lesions in the liver, spleen, and left adrenal gland. Suggest correlation with prior outside imaging or nonemergent evaluation with contrast-enhanced abdominal MRI (preferably as an outpatient when the patient is able to follow directions and breath hold). 6. Aortic Atherosclerosis (ICD10-I70.0). Prior abdominal aortic aneurysm repair.  Discussed with Dr Kathlene Cote In review of imaging notes probable liver lesion Rec: CT Chest/Abd/Pelv with CX Evaluate for liver lesions  Would be safest and best yield for liver lesion biopsy as opposed to bony lesion  Discussed with Dr Murvin Natal He will order CT  Will await results Plan for bx accordingly

## 2017-07-15 NOTE — Progress Notes (Signed)
Ricky Blankenship Catheter removed per MD order.

## 2017-07-16 DIAGNOSIS — I10 Essential (primary) hypertension: Secondary | ICD-10-CM

## 2017-07-16 DIAGNOSIS — C7951 Secondary malignant neoplasm of bone: Secondary | ICD-10-CM

## 2017-07-16 DIAGNOSIS — R531 Weakness: Secondary | ICD-10-CM

## 2017-07-16 DIAGNOSIS — I4891 Unspecified atrial fibrillation: Secondary | ICD-10-CM

## 2017-07-16 DIAGNOSIS — E871 Hypo-osmolality and hyponatremia: Secondary | ICD-10-CM

## 2017-07-16 DIAGNOSIS — A419 Sepsis, unspecified organism: Principal | ICD-10-CM

## 2017-07-16 DIAGNOSIS — R319 Hematuria, unspecified: Secondary | ICD-10-CM

## 2017-07-16 DIAGNOSIS — N390 Urinary tract infection, site not specified: Secondary | ICD-10-CM

## 2017-07-16 LAB — RENAL FUNCTION PANEL
ANION GAP: 10 (ref 5–15)
Albumin: 3.5 g/dL (ref 3.5–5.0)
BUN: 22 mg/dL — ABNORMAL HIGH (ref 6–20)
CALCIUM: 9 mg/dL (ref 8.9–10.3)
CHLORIDE: 97 mmol/L — AB (ref 101–111)
CO2: 24 mmol/L (ref 22–32)
Creatinine, Ser: 0.84 mg/dL (ref 0.61–1.24)
GFR calc non Af Amer: >60 mL/min (ref >60)
Glucose, Bld: 134 mg/dL — ABNORMAL HIGH (ref 65–99)
Phosphorus: 3 mg/dL (ref 2.5–4.6)
Potassium: 3.9 mmol/L (ref 3.5–5.1)
Sodium: 131 mmol/L — ABNORMAL LOW (ref 135–145)

## 2017-07-16 LAB — CBC WITH DIFFERENTIAL/PLATELET
Basophils Absolute: 0 10*3/uL (ref 0.0–0.1)
Basophils Relative: 0 %
EOS PCT: 2 %
Eosinophils Absolute: 0.2 10*3/uL (ref 0.0–0.7)
HCT: 36.3 % — ABNORMAL LOW (ref 39.0–52.0)
Hemoglobin: 12.2 g/dL — ABNORMAL LOW (ref 13.0–17.0)
LYMPHS ABS: 1.1 10*3/uL (ref 0.7–4.0)
LYMPHS PCT: 11 %
MCH: 33 pg (ref 26.0–34.0)
MCHC: 33.6 g/dL (ref 30.0–36.0)
MCV: 98.1 fL (ref 78.0–100.0)
MONO ABS: 1.1 10*3/uL — AB (ref 0.1–1.0)
Monocytes Relative: 10 %
Neutro Abs: 8.4 10*3/uL — ABNORMAL HIGH (ref 1.7–7.7)
Neutrophils Relative %: 77 %
Platelets: 148 10*3/uL — ABNORMAL LOW (ref 150–400)
RBC: 3.7 MIL/uL — ABNORMAL LOW (ref 4.22–5.81)
RDW: 13.8 % (ref 11.5–15.5)
WBC: 10.8 10*3/uL — ABNORMAL HIGH (ref 4.0–10.5)

## 2017-07-16 LAB — GLUCOSE, CAPILLARY: Glucose-Capillary: 125 mg/dL — ABNORMAL HIGH (ref 65–99)

## 2017-07-16 NOTE — Clinical Social Work Note (Signed)
SW following. Left message for admissions coordinator at [facility] inquiring if pt is accepted. Have not received return call. Per MD, pt is not stable for dc today. Assigned SW will follow up on Friday to further assist with pt's dc planning needs.

## 2017-07-16 NOTE — Progress Notes (Signed)
Ricky Blankenship  MRN: [id number]  DOB/AGE: 03-29-1932 83 y.o.  Primary Care Physician:Vasireddy, Lanetta Inch, MD  Admit date: 07/11/2017  Chief Complaint:  Chief Complaint  Patient presents with  . Generalized Body Aches    S-Pt presented on  07/11/2017 with  Chief Complaint  Patient presents with  . Generalized Body Aches  .    Pt today feels better. Pt says " I feel well"   meds . amLODipine  10 mg Oral Daily  . aspirin EC  81 mg Oral Daily  . atorvastatin  20 mg Oral q1800  . carvedilol  12.5 mg Oral BID WC  . donepezil  5 mg Oral QHS  . escitalopram  10 mg Oral Daily  . feeding supplement (ENSURE ENLIVE)  237 mL Oral BID BM  . tamsulosin  0.4 mg Oral QPC breakfast       Physical Exam: Vital signs in last 24 hours: Temp:  [97.6 F (36.4 C)-98.8 F (37.1 C)] 97.6 F (36.4 C) (11/21 0607) Pulse Rate:  [79-114] 93 (11/21 0607) Resp:  [18-20] 18 (11/21 0607) BP: (107-159)/(50-85) 159/85 (11/21 0607) SpO2:  [96 %] 96 % (11/21 0607) Weight change:  Last BM Date: 07/13/17  Intake/Output from previous day: 11/20 0701 - 11/21 0700 In: 2437 [P.O.:840; I.V.:1547; IV Piggyback:50] Out: 700 [Urine:700] No intake/output data recorded.   Physical Exam: General- pt is awake,alert, oriented to time place and person Resp- No acute REsp distress, CTA B/L NO Rhonchi CVS- S1S2 regular in rate and rhythm GIT- BS+, soft, NT, ND EXT- NO LE Edema, Cyanosis   Lab Results: CBC Recent Labs    07/15/17 0433 07/16/17 0404  WBC 10.2 10.8*  HGB 12.2* 12.2*  HCT 35.7* 36.3*  PLT 159 148*    BMET Recent Labs    07/15/17 0433 07/16/17 0404  NA 133* 131*  K 4.0 3.9  CL 99* 97*  CO2 24 24  GLUCOSE 133* 134*  BUN 24* 22*  CREATININE 1.05 0.84  CALCIUM 8.9 9.0    Trend Creat 2018  2.55=> 1.05=>0.8  Sodium 2018  128=> 133  MICRO Recent Results (from the past 240 hour(s))  Urine Culture     Status: None   Collection Time: 07/12/17  8:30 AM  Result  Value Ref Range Status   Specimen Description URINE, CLEAN CATCH  Final   Special Requests NONE  Final   Culture   Final    NO GROWTH Performed at [facility], [place] [address] [place],  [address]    Report Status 07/14/2017 FINAL  Final      Lab Results  Component Value Date   CALCIUM 9.0 07/16/2017   PHOS 3.0 07/16/2017               Impression: 1)Renal  AKI secondary to Hypovolemia/Post renal               AKI now better               Creat trending down  2)HTN  Medication- On Calcium Channel Blockers On Alpha and beta Blockers   3)Anemia HGb at goal (9--11)   4)CKD Mineral-Bone Disorder Phosphorus at goal. Calcium is at goal.  5)Urology-Pt with hydronephrosis Urology and Primary MD following  6)FEN   Normokalemia  Hyponatremic    Stable    Sec to SSRI?   7)Acid base Co2 at goal     Plan:  Will d/c IVF as AKI better Will continue current  care      [place] S 07/16/2017, 9:04 AM

## 2017-07-16 NOTE — Progress Notes (Signed)
Updated via Interventional Radiology [facility], requested that pt have a liver biopsy performed at [facility] on Friday, 11/23. Wife called to get informed consent, and she is still thinking about it and wants to talk to the daughter. Once wife agrees, consent to be signed and transportation needs to be set up for pt to arrive at [facility] at noon on Friday. Nurse for that shift needs to let secretary know to set up transportation.

## 2017-07-16 NOTE — Consult Note (Signed)
Chief Complaint: Patient was seen in consultation today for liver lesion biopsy Chief Complaint  Patient presents with  . Generalized Body Aches   at the request of Dr D Tat  Supervising Physician: Corrie Mckusick  Patient Status: [facility] IP  History of Present Illness: Ricky Blankenship is a 83 y.o. male   Admitted through ED with generalized weakness Hx HTN; PAF; CAD/MI AAA repair; mild dementia RCC- Left Nephrectomy; Bladder Ca  Sepsis with UTI Weakness and frequent falls Coreg for atrial fib  Request for bone met biopsy Was recommended to have CT CAP: 11/20: IMPRESSION: 1. 2.9 cm left upper lobe pulmonary nodule. This is associated with left hilar lymphadenopathy. Imaging features may be related to metastatic disease in this patient with a history of renal and bladder cancer. Primary bronchogenic neoplasm is also consideration. 2. Multiple hypoenhancing lesions scattered through both hepatic lobes consistent with metastatic disease. 3. Multiple lytic bone lesions identified in the ribs, sternum, spine, and bony pelvis, consistent with metastatic disease. 4. Hyperattenuating lesion posterior wall distal esophagus, indeterminate. As this was not visible on the noncontrast CT yesterday, the hyperattenuation is related to contrast and is probably oral contrast pooling in a distal esophageal diverticulum or large ulcer. Enhancement from intravascular contrast of a lesion such is vascular malformation is less likely. 5. Small hypoattenuating lesions in the medial spleen, indeterminate. 6.  Aortic Atherosclerois (ICD10-170.0) 7. With the patient has had recent imaging elsewhere, comparison to that earlier imaging would likely prove helpful. In the absence of recent imaging, PET-CT may be beneficial to further evaluate.  Imaging reviewed with Dr Earleen Newport He approves liver lesion biopsy  Discussed with Dr Tat Consider Bronchoscopy---but he feels best to move ahead with  liver lesion bx    Past Medical History:  Diagnosis Date  . Abdominal aortic aneurysm (AAA) ([place])   . Bladder cancer ([place])   . Cancer of kidney excluding renal pelvis ([place])   . Diabetes mellitus without complication ([place])   . Hypertension   . PAF (paroxysmal atrial fibrillation) ([place])     Past Surgical History:  Procedure Laterality Date  . ABDOMINAL AORTIC ANEURYSM REPAIR    . ABDOMINAL SURGERY      Allergies: Morphine and related  Medications: Prior to Admission medications   Medication Sig Start Date End Date Taking? Authorizing Provider  amLODipine (NORVASC) 10 MG tablet Take 10 mg daily by mouth.   Yes [provider]  aspirin EC 81 MG tablet Take 81 mg daily by mouth.   Yes [provider]  atorvastatin (LIPITOR) 20 MG tablet Take 20 mg daily by mouth.   Yes [provider]  benazepril (LOTENSIN) 20 MG tablet Take 20 mg at bedtime by mouth.   Yes [provider]  carvedilol (COREG) 12.5 MG tablet Take 12.5 mg 2 (two) times daily with a meal by mouth.   Yes [provider]  docusate sodium (COLACE) 100 MG capsule Take 100 mg daily by mouth.   Yes [provider]  donepezil (ARICEPT) 5 MG tablet Take 5 mg at bedtime by mouth.   Yes [provider]  escitalopram (LEXAPRO) 10 MG tablet Take 10 mg daily by mouth.   Yes [provider]  Flax Oil-Fish Oil-Borage Oil (FISH-FLAX-BORAGE) CAPS Take 1 capsule daily by mouth.   Yes [provider]  Multiple Vitamins-Minerals (EQ COMPLETE MULTIVIT ADULT 50+) TABS Take 1 tablet daily by mouth.   Yes [provider]     History reviewed.  No pertinent family history.  Social History   Socioeconomic History  . Marital status: Married    Spouse name: None  . Number of children: None  . Years of education: None  . Highest education level: None  Social Needs  . Financial resource strain: None  . Food insecurity - worry: None  . Food  insecurity - inability: None  . Transportation needs - medical: None  . Transportation needs - non-medical: None  Occupational History  . None  Tobacco Use  . Smoking status: Never Smoker  . Smokeless tobacco: Never Used  Substance and Sexual Activity  . Alcohol use: No    Frequency: Never  . Drug use: No  . Sexual activity: None  Other Topics Concern  . None  Social History Narrative  . None    Review of Systems: A 12 point ROS discussed and pertinent positives are indicated in the HPI above.  All other systems are negative.  Review of Systems  Constitutional: Positive for activity change and fatigue. Negative for appetite change and fever.  Respiratory: Negative for cough and shortness of breath.   Cardiovascular: Negative for chest pain.  Gastrointestinal: Negative for abdominal pain.  Musculoskeletal: Positive for back pain and gait problem.  Neurological: Positive for weakness.  Psychiatric/Behavioral: Positive for confusion. Negative for behavioral problems.    Vital Signs: BP (!) 159/85 (BP Location: Left Arm)   Pulse 93   Temp 97.6 F (36.4 C) (Oral)   Resp 18   Ht '5\' 8"'  (1.727 m)   Wt 199 lb 8.3 oz (90.5 kg)   SpO2 96%   BMI 30.34 kg/m   Physical Exam  Cardiovascular: Normal rate, regular rhythm and normal heart sounds.  Pulmonary/Chest: Effort normal and breath sounds normal.  Abdominal: Soft. Bowel sounds are normal. There is tenderness.  Musculoskeletal: Normal range of motion.  Neurological: He is alert.  Skin: Skin is warm and dry.  Psychiatric:  Confused Unable to answer dob; time or place  Called wife Ricky Blankenship Explained risks and benefits of procedure She is discussing bx with Dtr Will consent over phone to myself or RN  Nursing note and vitals reviewed.   Imaging: Ct Head Wo Contrast  Result Date: 07/11/2017 CLINICAL DATA:  83 year old male with generalized weakness. EXAM: CT HEAD WITHOUT CONTRAST TECHNIQUE: Contiguous axial images were  obtained from the base of the skull through the vertex without intravenous contrast. COMPARISON:  None. FINDINGS: Brain: There is moderate age-related atrophy and chronic microvascular ischemic changes. Small left lentiform nucleus old lacunar infarct. There is no acute intracranial hemorrhage. No mass effect midline shift noted. No extra-axial fluid collection. Vascular: There is atherosclerotic calcification of the cavernous ICA and right MCA. Atherosclerotic calcification of the right vertebral artery. Skull: Normal. Negative for fracture or focal lesion. Sinuses/Orbits: No acute finding. Other: Bilateral cataract surgeries. IMPRESSION: 1. No acute intracranial hemorrhage. 2. Moderate age-related atrophy and chronic microvascular ischemic changes. If symptoms persist, and there are no contraindications, MRI may provide better evaluation if clinically indicated. Electronically Signed   By: Anner Crete M.D.   On: 07/11/2017 22:15   Ct Chest W Contrast  Result Date: 07/16/2017 CLINICAL DATA:  Bladder cancer and history of left nephrectomy for renal cell carcinoma. EXAM: CT CHEST, ABDOMEN, AND PELVIS WITH CONTRAST TECHNIQUE: Multidetector CT imaging of the chest, abdomen and pelvis was performed following the standard protocol during bolus administration of intravenous contrast. CONTRAST:  121m ISOVUE-300 IOPAMIDOL (ISOVUE-300) INJECTION 61% COMPARISON:  Noncontrast abdomen and pelvis  CT 07/14/2017 FINDINGS: CT CHEST FINDINGS Cardiovascular: Heart size upper normal. Coronary artery calcification is evident. Atherosclerotic calcification is noted in the wall of the thoracic aorta. Mediastinum/Nodes: 8 mm short axis subcarinal lymph node is upper normal. 8 mm short axis lymph node identified in the left hilum. There is abnormal soft tissue in left hilum, circumferentially encasing the left upper lobe bronchus. Calcified nodal tissue is seen in the left hilum. There is an area of hyper enhancement in the  posterior wall the distal esophagus (see image 48 series 2). Lungs/Pleura: 2.0 x 2.9 cm nodule is identified in the left upper lobe. 3 mm uncalcified right upper lobe nodule is seen on image 61 in the posterior right upper lobe noncalcified 3-4 mm nodule is seen on image 47. There is some trace atelectasis in the lung bases. Small calcified granuloma identified right lung. Musculoskeletal: Multiple lytic bone lesions are identified including sternum (image 53), posterior right fourth rib, lateral right eighth rib, and lateral left fifth rib. CT ABDOMEN PELVIS FINDINGS Hepatobiliary: Multiple ill-defined hypoattenuating liver lesions are identified involving both hepatic lobes. Index lesion posterior right liver measures 11 mm on image 53 series 2. There is no evidence for gallstones, gallbladder wall thickening, or pericholecystic fluid. No intrahepatic or extrahepatic biliary dilation. Pancreas: No focal mass lesion. No dilatation of the main duct. No intraparenchymal cyst. No peripancreatic edema. Spleen: Calcified granulomata noted in the spleen. Small hypoattenuating lesions in the medial spleen and measure up to 12 mm. Adrenals/Urinary Tract: Thickening of the adrenal glands noted without discrete nodule or mass. Left kidney surgically absent. Rim calcified lesion posterior left retroperitoneal space likely postsurgical in may represent chronic hematoma. Mild right hydronephrosis noted with probable 13 mm cyst interpolar right kidney. The degree of collecting system distention seems out of proportion to the degree of right ureteral fullness suggesting a component of right UPJ obstruction. Bladder is moderately distended. Stomach/Bowel: Stomach is nondistended. No gastric wall thickening. No evidence of outlet obstruction. Duodenum is normally positioned as is the ligament of Treitz. No small bowel wall thickening. No small bowel dilatation. The terminal ileum is normal. The appendix is normal. Diverticular  changes are noted in the left colon without evidence of diverticulitis. Vascular/Lymphatic: Status post aorto bi-iliac bypass grafting. Upper normal lymph nodes are seen in the hepatoduodenal ligament. Small retroperitoneal lymph nodes identified. No pelvic sidewall lymphadenopathy. Reproductive: Central TURP defect identified in the prostate gland. Other: No intraperitoneal free fluid. Musculoskeletal: Multiple lytic bone lesions are identified including 2.9 cm lesion right L5 vertebral body. Several small lytic lesions are seen in the right iliac bone. IMPRESSION: 1. 2.9 cm left upper lobe pulmonary nodule. This is associated with left hilar lymphadenopathy. Imaging features may be related to metastatic disease in this patient with a history of renal and bladder cancer. Primary bronchogenic neoplasm is also consideration. 2. Multiple hypoenhancing lesions scattered through both hepatic lobes consistent with metastatic disease. 3. Multiple lytic bone lesions identified in the ribs, sternum, spine, and bony pelvis, consistent with metastatic disease. 4. Hyperattenuating lesion posterior wall distal esophagus, indeterminate. As this was not visible on the noncontrast CT yesterday, the hyperattenuation is related to contrast and is probably oral contrast pooling in a distal esophageal diverticulum or large ulcer. Enhancement from intravascular contrast of a lesion such is vascular malformation is less likely. 5. Small hypoattenuating lesions in the medial spleen, indeterminate. 6.  Aortic Atherosclerois (ICD10-170.0) 7. With the patient has had recent imaging elsewhere, comparison to that earlier imaging would  likely prove helpful. In the absence of recent imaging, PET-CT may be beneficial to further evaluate. Electronically Signed   By: Misty Stanley M.D.   On: 07/16/2017 07:53   Ct Abdomen Pelvis W Contrast  Result Date: 07/16/2017 CLINICAL DATA:  Bladder cancer and history of left nephrectomy for renal cell  carcinoma. EXAM: CT CHEST, ABDOMEN, AND PELVIS WITH CONTRAST TECHNIQUE: Multidetector CT imaging of the chest, abdomen and pelvis was performed following the standard protocol during bolus administration of intravenous contrast. CONTRAST:  124m ISOVUE-300 IOPAMIDOL (ISOVUE-300) INJECTION 61% COMPARISON:  Noncontrast abdomen and pelvis CT 07/14/2017 FINDINGS: CT CHEST FINDINGS Cardiovascular: Heart size upper normal. Coronary artery calcification is evident. Atherosclerotic calcification is noted in the wall of the thoracic aorta. Mediastinum/Nodes: 8 mm short axis subcarinal lymph node is upper normal. 8 mm short axis lymph node identified in the left hilum. There is abnormal soft tissue in left hilum, circumferentially encasing the left upper lobe bronchus. Calcified nodal tissue is seen in the left hilum. There is an area of hyper enhancement in the posterior wall the distal esophagus (see image 48 series 2). Lungs/Pleura: 2.0 x 2.9 cm nodule is identified in the left upper lobe. 3 mm uncalcified right upper lobe nodule is seen on image 61 in the posterior right upper lobe noncalcified 3-4 mm nodule is seen on image 47. There is some trace atelectasis in the lung bases. Small calcified granuloma identified right lung. Musculoskeletal: Multiple lytic bone lesions are identified including sternum (image 53), posterior right fourth rib, lateral right eighth rib, and lateral left fifth rib. CT ABDOMEN PELVIS FINDINGS Hepatobiliary: Multiple ill-defined hypoattenuating liver lesions are identified involving both hepatic lobes. Index lesion posterior right liver measures 11 mm on image 53 series 2. There is no evidence for gallstones, gallbladder wall thickening, or pericholecystic fluid. No intrahepatic or extrahepatic biliary dilation. Pancreas: No focal mass lesion. No dilatation of the main duct. No intraparenchymal cyst. No peripancreatic edema. Spleen: Calcified granulomata noted in the spleen. Small  hypoattenuating lesions in the medial spleen and measure up to 12 mm. Adrenals/Urinary Tract: Thickening of the adrenal glands noted without discrete nodule or mass. Left kidney surgically absent. Rim calcified lesion posterior left retroperitoneal space likely postsurgical in may represent chronic hematoma. Mild right hydronephrosis noted with probable 13 mm cyst interpolar right kidney. The degree of collecting system distention seems out of proportion to the degree of right ureteral fullness suggesting a component of right UPJ obstruction. Bladder is moderately distended. Stomach/Bowel: Stomach is nondistended. No gastric wall thickening. No evidence of outlet obstruction. Duodenum is normally positioned as is the ligament of Treitz. No small bowel wall thickening. No small bowel dilatation. The terminal ileum is normal. The appendix is normal. Diverticular changes are noted in the left colon without evidence of diverticulitis. Vascular/Lymphatic: Status post aorto bi-iliac bypass grafting. Upper normal lymph nodes are seen in the hepatoduodenal ligament. Small retroperitoneal lymph nodes identified. No pelvic sidewall lymphadenopathy. Reproductive: Central TURP defect identified in the prostate gland. Other: No intraperitoneal free fluid. Musculoskeletal: Multiple lytic bone lesions are identified including 2.9 cm lesion right L5 vertebral body. Several small lytic lesions are seen in the right iliac bone. IMPRESSION: 1. 2.9 cm left upper lobe pulmonary nodule. This is associated with left hilar lymphadenopathy. Imaging features may be related to metastatic disease in this patient with a history of renal and bladder cancer. Primary bronchogenic neoplasm is also consideration. 2. Multiple hypoenhancing lesions scattered through both hepatic lobes consistent with metastatic disease.  3. Multiple lytic bone lesions identified in the ribs, sternum, spine, and bony pelvis, consistent with metastatic disease. 4.  Hyperattenuating lesion posterior wall distal esophagus, indeterminate. As this was not visible on the noncontrast CT yesterday, the hyperattenuation is related to contrast and is probably oral contrast pooling in a distal esophageal diverticulum or large ulcer. Enhancement from intravascular contrast of a lesion such is vascular malformation is less likely. 5. Small hypoattenuating lesions in the medial spleen, indeterminate. 6.  Aortic Atherosclerois (ICD10-170.0) 7. With the patient has had recent imaging elsewhere, comparison to that earlier imaging would likely prove helpful. In the absence of recent imaging, PET-CT may be beneficial to further evaluate. Electronically Signed   By: Misty Stanley M.D.   On: 07/16/2017 07:53   US Renal  Result Date: 07/12/2017 CLINICAL DATA:  Sepsis, UTI.  Prior left nephrectomy EXAM: RENAL / URINARY TRACT ULTRASOUND COMPLETE COMPARISON:  None. FINDINGS: Right Kidney: Length: 13.0 cm. Moderate hydronephrosis. No visible mass. Normal echotexture. Left Kidney: Length: Prior left nephrectomy. Bladder: Appears normal for degree of bladder distention. IMPRESSION: Moderate right hydronephrosis. Prior left nephrectomy. Electronically Signed   By: Rolm Baptise M.D.   On: 07/12/2017 12:44   Dg Chest Port 1 View  Result Date: 07/11/2017 CLINICAL DATA:  Weakness for 1 month EXAM: PORTABLE CHEST 1 VIEW COMPARISON:  None. FINDINGS: Cardiac shadow is within normal limits. Aortic calcifications are noted. The lungs are well aerated bilaterally. No focal infiltrate or sizable effusion is seen. Prominent calcification is noted the costal sternal margins of the first ribs bilaterally. IMPRESSION: No acute abnormality noted. Electronically Signed   By: Inez Catalina M.D.   On: 07/11/2017 21:35   Ct Renal Stone Study  Result Date: 07/14/2017 CLINICAL DATA:  Hydronephrosis. Sepsis. Urinary tract infection. History of renal cell carcinoma and left nephrectomy. EXAM: CT ABDOMEN AND PELVIS  WITHOUT CONTRAST TECHNIQUE: Multidetector CT imaging of the abdomen and pelvis was performed following the standard protocol without IV contrast. COMPARISON:  Renal ultrasound 07/12/2017 FINDINGS: Lower chest: Minimal atelectasis in the lung bases. No pleural effusion. Extensive coronary artery atherosclerosis. Hepatobiliary: Numerous small hypodense lesions in the liver measuring up to 1.5 cm, incompletely characterized without IV contrast though with the larger lesions not clearly having attenuation of cysts. Unremarkable gallbladder. No significant biliary dilatation. Pancreas: Small calcifications scattered throughout the pancreas which may reflect the sequelae of chronic pancreatitis. No evidence of significant acute peripancreatic inflammation or ductal dilatation. Spleen: Punctate calcifications in the spleen as well as a poorly defined 8 mm low-density lesion medially. Adrenals/Urinary Tract: 1.7 cm left adrenal nodule (attenuation of 20 HU). Unremarkable right adrenal gland. Prior left nephrectomy. Peripherally calcified retroperitoneal collection extending from the left renal fossa inferiorly over a length of 10 cm, likely old hematoma. 1.5 cm hypodense lesion in the medial interpolar right kidney, likely a cyst. Mild right hydronephrosis. At most minimal dilatation or wall thickening of the proximal right ureter, decompressed distally. Foley catheter in place with decompressed bladder. No urinary tract calculi identified. Stomach/Bowel: Small sliding hiatal hernia. No evidence of bowel obstruction. Prominent sigmoid colon diverticulosis with mild surrounding fat stranding. Unremarkable appendix. Vascular/Lymphatic: Atherosclerosis of the abdominal aorta and its major branch vessels. Prior aortic aneurysm repair, with the infrarenal abdominal aorta measuring up to 3.8 cm in diameter. No enlarged lymph nodes. Reproductive: Unremarkable prostate. Symmetric mild nodularity of the seminal vesicles. Other: No  intraperitoneal free fluid. Postsurgical changes in the midline anterior abdominal wall. Musculoskeletal: Multiple small lucent osseous lesions  in the pelvis, including a 1.9 cm lesion in the posterior left ilium. 2.7 cm lytic lesion with cortical disruption in the right aspect of the L5 vertebral body. Scattered small sclerotic spine lesions as well. Mild T11 compression fracture with inferior endplate Schmorl's node deformity, likely chronic with a bridging osteophyte noted across the anterior disc space. IMPRESSION: 1. Mild right hydronephrosis without obstructing stone identified. Upper tract infection is possible. 2. Sigmoid colon diverticulosis with mild surrounding stranding, query mild acute diverticulitis. 3. Prior left nephrectomy. Calcified collection in the left retroperitoneum likely reflects an old hematoma. 4. Multiple osseous lesions, including a 2.7 cm lytic lesion in the L5 vertebral body concerning for metastases. 5. Indeterminate lesions in the liver, spleen, and left adrenal gland. Suggest correlation with prior outside imaging or nonemergent evaluation with contrast-enhanced abdominal MRI (preferably as an outpatient when the patient is able to follow directions and breath hold). 6. Aortic Atherosclerosis (ICD10-I70.0). Prior abdominal aortic aneurysm repair. Electronically Signed   By: Logan Bores M.D.   On: 07/14/2017 12:42    Labs:  CBC: Recent Labs    07/13/17 0647 07/14/17 0609 07/15/17 0433 07/16/17 0404  WBC 12.4* 10.7* 10.2 10.8*  HGB 12.2* 11.9* 12.2* 12.2*  HCT 35.6* 34.0* 35.7* 36.3*  PLT 156 160 159 148*    COAGS: Recent Labs    07/12/17 0654  INR 1.02  APTT 29    BMP: Recent Labs    07/13/17 0647 07/14/17 0609 07/15/17 0433 07/16/17 0404  NA 131* 133* 133* 131*  K 3.6 3.2* 4.0 3.9  CL 98* 98* 99* 97*  CO2 21* '23 24 24  ' GLUCOSE 130* 130* 133* 134*  BUN 44* 28* 24* 22*  CALCIUM 8.8* 8.7* 8.9 9.0  CREATININE 2.55* 1.24 1.05 0.84  GFRNONAA 22*  52* >60 >60  GFRAA 25* >60 >60 >60    LIVER FUNCTION TESTS: Recent Labs    07/11/17 2127 07/14/17 0609 07/15/17 0433 07/16/17 0404  BILITOT 1.6*  --   --   --   AST 46*  --   --   --   ALT 57  --   --   --   ALKPHOS 150*  --   --   --   PROT 7.6  --   --   --   ALBUMIN 4.5 3.6 3.6 3.5    TUMOR MARKERS: No results for input(s): AFPTM, CEA, CA199, CHROMGRNA in the last 8760 hours.  Assessment and Plan:  Weakness Abnormal CT CAP; lung nodules  liver lesions; bony mets; Lymphadenopathy Intend to perform Liver lesion biopsy at [facility] 11/23 Discussed with wife risks and benefits and answered all questions to satisfaction. Wife and daughter still need to consent   Thank you for this interesting consult.  I greatly enjoyed meeting Ricky Blankenship and look forward to participating in their care.  A copy of this report was sent to the requesting provider on this date.  Electronically Signed: Lavonia Drafts, PA-C 07/16/2017, 11:10 AM   I spent a total of 40 Minutes    in face to face in clinical consultation, greater than 50% of which was counseling/coordinating care for liver lesion biopsy

## 2017-07-16 NOTE — Progress Notes (Signed)
PROGRESS NOTE  Ricky Blankenship JJH:[id number] DOB: October 16, 1931 DOA: 07/11/2017 PCP: Kendrick Ranch, MD  Brief History:   83 y.o. male with medical history significant of HTN, PAF not on anticoagulation, CAD, MI in 1986, AAA s/p surgical repair, mild dementia, RCC s/p nephrectomy, and bladder cancer; who presents with complaints of generalized weakness. Symptoms appear to have been progressively worsening over the last 4 weeks after patient had been diagnosed with bronchitis.  He notes that he may have been given antibiotics and took them as directed.  He has had generalized weakness and has gotten to the point in which he is unable to walk over the last week.  Previously falling once or twice per week now every day.  He reports his legs just not being able to hold him up.  At the time of admission, the patient was tachycardic with WBC of 14,000.  The patient was started on ceftriaxone for presumptive sepsis from UTI.  He was noted to have right-sided hydronephrosis on CT renal protocol.  Urology was consulted.  A Foley catheter was placed initially with relief of the symptoms.  However after removal of his Foley catheter, the patient's bladder once again became significantly distended.  As a result, urology recommended reinserting the patient's Foley catheter.  Assessment/Plan: Sepsis -Present at the time of admission -Urine culture negative, blood culture was done after antibiotics were given -Continue ceftriaxone -WBC improved -Sepsis physiology resolved  AKI -Postobstructive would likely a component of volume depletion -Improved  Urinary retention/hematuria/right-sided hydronephrosis -Repeat CT abdomen after Foley catheter was removed showed significant distention of the patient's urinary bladder -07/16/17--Case discussed with urology, Dr. Margret Chance foley-->plan to d/c with foley in place until he follows up in urology office -Dr. Jeffie Pollock doubts R-UPJ  obstruction -hematuria likely due to UTI and bladder cancer -continue flomax  Liver and bone metastasis/Lung nodules -11/20--CT chest and abdomen shows no external lesions, lytic rib lesions, hyper attenuating liver lesions, and dominant LUL lung nodule -Liver biopsy planned for July 18, 2017 -PSA 0.57 -Outpatient PET scan  Atrial fibrillation, type unspecified -Rate controlled -Continue carvedilol -not AC candidate due to frequent falls -CHADS-VASc = 4  Hyponatremia -Multifactorial including poor solute intake, Lexapro, and possible component of SIADH -Overall stable  Essential hypertension -Continue carvedilol  Dementia without behavioral disturbance -Continue Aricept   Hyperlipidemia -Continue statin   Disposition Plan:   SNF 11/13 or 11/24 Family Communication:   No Family at bedside  Consultants:  Urology, nephrology  Code Status:  FULL  DVT Prophylaxis:  SCDs   Procedures: As Listed in Progress Note Above  Antibiotics: Ceftriaxone 11/17>>>    Subjective: Patient denies fevers, chills, headache, chest pain, dyspnea, nausea, vomiting, diarrhea, abdominal pain, dysuria, hematuria, hematochezia, and melena.   Objective: Vitals:   07/15/17 1300 07/15/17 2050 07/16/17 0607 07/16/17 1429  BP: (!) 107/50 139/67 (!) 159/85 130/84  Pulse: (!) 114 79 93 66  Resp: 20 18 18 18   Temp: 97.9 F (36.6 C) 98.8 F (37.1 C) 97.6 F (36.4 C) 98.5 F (36.9 C)  TempSrc: Oral Oral Oral Oral  SpO2: 96% 96% 96% 96%  Weight:      Height:        Intake/Output Summary (Last 24 hours) at 07/16/2017 1658 Last data filed at 07/16/2017 1500 Gross per 24 hour  Intake 1414.33 ml  Output -  Net 1414.33 ml   Weight change:  Exam:   General:  Pt is  alert, follows commands appropriately, not in acute distress  HEENT: No icterus, No thrush, No neck mass, [place]/AT  Cardiovascular: IRRR, S1/S2, no rubs, no gallops  Respiratory: CTA bilaterally, no wheezing, no  crackles, no rhonchi  Abdomen: Soft/+BS, non tender, non distended, no guarding  Extremities: trace LE edema, No lymphangitis, No petechiae, No rashes, no synovitis   Data Reviewed: I have personally reviewed following labs and imaging studies Basic Metabolic Panel: Recent Labs  Lab 07/12/17 0654 07/13/17 0647 07/14/17 0609 07/15/17 0433 07/16/17 0404  NA 130* 131* 133* 133* 131*  K 3.7 3.6 3.2* 4.0 3.9  CL 95* 98* 98* 99* 97*  CO2 22 21* 23 24 24   GLUCOSE 117* 130* 130* 133* 134*  BUN 28* 44* 28* 24* 22*  CREATININE 1.17 2.55* 1.24 1.05 0.84  CALCIUM 9.6 8.8* 8.7* 8.9 9.0  MG 2.5*  --   --   --   --   PHOS  --   --  3.0 2.5 3.0   Liver Function Tests: Recent Labs  Lab 07/11/17 2127 07/14/17 0609 07/15/17 0433 07/16/17 0404  AST 46*  --   --   --   ALT 57  --   --   --   ALKPHOS 150*  --   --   --   BILITOT 1.6*  --   --   --   PROT 7.6  --   --   --   ALBUMIN 4.5 3.6 3.6 3.5   No results for input(s): LIPASE, AMYLASE in the last 168 hours. No results for input(s): AMMONIA in the last 168 hours. Coagulation Profile: Recent Labs  Lab 07/12/17 0654  INR 1.02   CBC: Recent Labs  Lab 07/11/17 2127 07/12/17 0654 07/13/17 0647 07/14/17 0609 07/15/17 0433 07/16/17 0404  WBC 14.1* 14.3* 12.4* 10.7* 10.2 10.8*  NEUTROABS 11.7*  --   --  8.3* 7.4 8.4*  HGB 13.2 13.2 12.2* 11.9* 12.2* 12.2*  HCT 38.8* 38.6* 35.6* 34.0* 35.7* 36.3*  MCV 96.8 96.7 95.7 95.2 97.3 98.1  PLT 177 178 156 160 159 148*   Cardiac Enzymes: Recent Labs  Lab 07/11/17 2127  TROPONINI <0.03   BNP: Invalid input(s): POCBNP CBG: Recent Labs  Lab 07/12/17 0816 07/12/17 1143 07/12/17 1631 07/15/17 2053 07/16/17 0744  GLUCAP 113* 184* 109* 149* 125*   HbA1C: No results for input(s): HGBA1C in the last 72 hours. Urine analysis:    Component Value Date/Time   COLORURINE YELLOW 07/11/2017 2240   APPEARANCEUR CLOUDY (A) 07/11/2017 2240   LABSPEC 1.014 07/11/2017 2240    PHURINE 5.0 07/11/2017 2240   GLUCOSEU NEGATIVE 07/11/2017 2240   HGBUR LARGE (A) 07/11/2017 2240   BILIRUBINUR NEGATIVE 07/11/2017 2240   KETONESUR 5 (A) 07/11/2017 2240   PROTEINUR 100 (A) 07/11/2017 2240   NITRITE NEGATIVE 07/11/2017 2240   LEUKOCYTESUR NEGATIVE 07/11/2017 2240   Sepsis Labs: @LABRCNTIP (procalcitonin:4,lacticidven:4) ) Recent Results (from the past 240 hour(s))  Urine Culture     Status: None   Collection Time: 07/12/17  8:30 AM  Result Value Ref Range Status   Specimen Description URINE, CLEAN CATCH  Final   Special Requests NONE  Final   Culture   Final    NO GROWTH Performed at [facility], [place] [address] [place], [place] [address]    Report Status 07/14/2017 FINAL  Final     Scheduled Meds: . amLODipine  10 mg Oral Daily  . aspirin EC  81 mg Oral Daily  .  atorvastatin  20 mg Oral q1800  . carvedilol  12.5 mg Oral BID WC  . donepezil  5 mg Oral QHS  . escitalopram  10 mg Oral Daily  . feeding supplement (ENSURE ENLIVE)  237 mL Oral BID BM  . tamsulosin  0.4 mg Oral QPC breakfast   Continuous Infusions: . sodium chloride 10 mL/hr at 07/16/17 1016  . cefTRIAXone (ROCEPHIN)  IV Stopped (07/15/17 2334)    Procedures/Studies: Ct Head Wo Contrast  Result Date: 07/11/2017 CLINICAL DATA:  83 year old male with generalized weakness. EXAM: CT HEAD WITHOUT CONTRAST TECHNIQUE: Contiguous axial images were obtained from the base of the skull through the vertex without intravenous contrast. COMPARISON:  None. FINDINGS: Brain: There is moderate age-related atrophy and chronic microvascular ischemic changes. Small left lentiform nucleus old lacunar infarct. There is no acute intracranial hemorrhage. No mass effect midline shift noted. No extra-axial fluid collection. Vascular: There is atherosclerotic calcification of the cavernous ICA and right MCA. Atherosclerotic calcification of the right vertebral artery. Skull: Normal. Negative for fracture or focal  lesion. Sinuses/Orbits: No acute finding. Other: Bilateral cataract surgeries. IMPRESSION: 1. No acute intracranial hemorrhage. 2. Moderate age-related atrophy and chronic microvascular ischemic changes. If symptoms persist, and there are no contraindications, MRI may provide better evaluation if clinically indicated. Electronically Signed   By: Anner Crete M.D.   On: 07/11/2017 22:15   Ct Chest W Contrast  Result Date: 07/16/2017 CLINICAL DATA:  Bladder cancer and history of left nephrectomy for renal cell carcinoma. EXAM: CT CHEST, ABDOMEN, AND PELVIS WITH CONTRAST TECHNIQUE: Multidetector CT imaging of the chest, abdomen and pelvis was performed following the standard protocol during bolus administration of intravenous contrast. CONTRAST:  158mL ISOVUE-300 IOPAMIDOL (ISOVUE-300) INJECTION 61% COMPARISON:  Noncontrast abdomen and pelvis CT 07/14/2017 FINDINGS: CT CHEST FINDINGS Cardiovascular: Heart size upper normal. Coronary artery calcification is evident. Atherosclerotic calcification is noted in the wall of the thoracic aorta. Mediastinum/Nodes: 8 mm short axis subcarinal lymph node is upper normal. 8 mm short axis lymph node identified in the left hilum. There is abnormal soft tissue in left hilum, circumferentially encasing the left upper lobe bronchus. Calcified nodal tissue is seen in the left hilum. There is an area of hyper enhancement in the posterior wall the distal esophagus (see image 48 series 2). Lungs/Pleura: 2.0 x 2.9 cm nodule is identified in the left upper lobe. 3 mm uncalcified right upper lobe nodule is seen on image 61 in the posterior right upper lobe noncalcified 3-4 mm nodule is seen on image 47. There is some trace atelectasis in the lung bases. Small calcified granuloma identified right lung. Musculoskeletal: Multiple lytic bone lesions are identified including sternum (image 53), posterior right fourth rib, lateral right eighth rib, and lateral left fifth rib. CT ABDOMEN  PELVIS FINDINGS Hepatobiliary: Multiple ill-defined hypoattenuating liver lesions are identified involving both hepatic lobes. Index lesion posterior right liver measures 11 mm on image 53 series 2. There is no evidence for gallstones, gallbladder wall thickening, or pericholecystic fluid. No intrahepatic or extrahepatic biliary dilation. Pancreas: No focal mass lesion. No dilatation of the main duct. No intraparenchymal cyst. No peripancreatic edema. Spleen: Calcified granulomata noted in the spleen. Small hypoattenuating lesions in the medial spleen and measure up to 12 mm. Adrenals/Urinary Tract: Thickening of the adrenal glands noted without discrete nodule or mass. Left kidney surgically absent. Rim calcified lesion posterior left retroperitoneal space likely postsurgical in may represent chronic hematoma. Mild right hydronephrosis noted with probable 13 mm cyst interpolar  right kidney. The degree of collecting system distention seems out of proportion to the degree of right ureteral fullness suggesting a component of right UPJ obstruction. Bladder is moderately distended. Stomach/Bowel: Stomach is nondistended. No gastric wall thickening. No evidence of outlet obstruction. Duodenum is normally positioned as is the ligament of Treitz. No small bowel wall thickening. No small bowel dilatation. The terminal ileum is normal. The appendix is normal. Diverticular changes are noted in the left colon without evidence of diverticulitis. Vascular/Lymphatic: Status post aorto bi-iliac bypass grafting. Upper normal lymph nodes are seen in the hepatoduodenal ligament. Small retroperitoneal lymph nodes identified. No pelvic sidewall lymphadenopathy. Reproductive: Central TURP defect identified in the prostate gland. Other: No intraperitoneal free fluid. Musculoskeletal: Multiple lytic bone lesions are identified including 2.9 cm lesion right L5 vertebral body. Several small lytic lesions are seen in the right iliac bone.  IMPRESSION: 1. 2.9 cm left upper lobe pulmonary nodule. This is associated with left hilar lymphadenopathy. Imaging features may be related to metastatic disease in this patient with a history of renal and bladder cancer. Primary bronchogenic neoplasm is also consideration. 2. Multiple hypoenhancing lesions scattered through both hepatic lobes consistent with metastatic disease. 3. Multiple lytic bone lesions identified in the ribs, sternum, spine, and bony pelvis, consistent with metastatic disease. 4. Hyperattenuating lesion posterior wall distal esophagus, indeterminate. As this was not visible on the noncontrast CT yesterday, the hyperattenuation is related to contrast and is probably oral contrast pooling in a distal esophageal diverticulum or large ulcer. Enhancement from intravascular contrast of a lesion such is vascular malformation is less likely. 5. Small hypoattenuating lesions in the medial spleen, indeterminate. 6.  Aortic Atherosclerois (ICD10-170.0) 7. With the patient has had recent imaging elsewhere, comparison to that earlier imaging would likely prove helpful. In the absence of recent imaging, PET-CT may be beneficial to further evaluate. Electronically Signed   By: Misty Stanley M.D.   On: 07/16/2017 07:53   Ct Abdomen Pelvis W Contrast  Result Date: 07/16/2017 CLINICAL DATA:  Bladder cancer and history of left nephrectomy for renal cell carcinoma. EXAM: CT CHEST, ABDOMEN, AND PELVIS WITH CONTRAST TECHNIQUE: Multidetector CT imaging of the chest, abdomen and pelvis was performed following the standard protocol during bolus administration of intravenous contrast. CONTRAST:  174mL ISOVUE-300 IOPAMIDOL (ISOVUE-300) INJECTION 61% COMPARISON:  Noncontrast abdomen and pelvis CT 07/14/2017 FINDINGS: CT CHEST FINDINGS Cardiovascular: Heart size upper normal. Coronary artery calcification is evident. Atherosclerotic calcification is noted in the wall of the thoracic aorta. Mediastinum/Nodes: 8 mm  short axis subcarinal lymph node is upper normal. 8 mm short axis lymph node identified in the left hilum. There is abnormal soft tissue in left hilum, circumferentially encasing the left upper lobe bronchus. Calcified nodal tissue is seen in the left hilum. There is an area of hyper enhancement in the posterior wall the distal esophagus (see image 48 series 2). Lungs/Pleura: 2.0 x 2.9 cm nodule is identified in the left upper lobe. 3 mm uncalcified right upper lobe nodule is seen on image 61 in the posterior right upper lobe noncalcified 3-4 mm nodule is seen on image 47. There is some trace atelectasis in the lung bases. Small calcified granuloma identified right lung. Musculoskeletal: Multiple lytic bone lesions are identified including sternum (image 53), posterior right fourth rib, lateral right eighth rib, and lateral left fifth rib. CT ABDOMEN PELVIS FINDINGS Hepatobiliary: Multiple ill-defined hypoattenuating liver lesions are identified involving both hepatic lobes. Index lesion posterior right liver measures 11 mm on image  53 series 2. There is no evidence for gallstones, gallbladder wall thickening, or pericholecystic fluid. No intrahepatic or extrahepatic biliary dilation. Pancreas: No focal mass lesion. No dilatation of the main duct. No intraparenchymal cyst. No peripancreatic edema. Spleen: Calcified granulomata noted in the spleen. Small hypoattenuating lesions in the medial spleen and measure up to 12 mm. Adrenals/Urinary Tract: Thickening of the adrenal glands noted without discrete nodule or mass. Left kidney surgically absent. Rim calcified lesion posterior left retroperitoneal space likely postsurgical in may represent chronic hematoma. Mild right hydronephrosis noted with probable 13 mm cyst interpolar right kidney. The degree of collecting system distention seems out of proportion to the degree of right ureteral fullness suggesting a component of right UPJ obstruction. Bladder is moderately  distended. Stomach/Bowel: Stomach is nondistended. No gastric wall thickening. No evidence of outlet obstruction. Duodenum is normally positioned as is the ligament of Treitz. No small bowel wall thickening. No small bowel dilatation. The terminal ileum is normal. The appendix is normal. Diverticular changes are noted in the left colon without evidence of diverticulitis. Vascular/Lymphatic: Status post aorto bi-iliac bypass grafting. Upper normal lymph nodes are seen in the hepatoduodenal ligament. Small retroperitoneal lymph nodes identified. No pelvic sidewall lymphadenopathy. Reproductive: Central TURP defect identified in the prostate gland. Other: No intraperitoneal free fluid. Musculoskeletal: Multiple lytic bone lesions are identified including 2.9 cm lesion right L5 vertebral body. Several small lytic lesions are seen in the right iliac bone. IMPRESSION: 1. 2.9 cm left upper lobe pulmonary nodule. This is associated with left hilar lymphadenopathy. Imaging features may be related to metastatic disease in this patient with a history of renal and bladder cancer. Primary bronchogenic neoplasm is also consideration. 2. Multiple hypoenhancing lesions scattered through both hepatic lobes consistent with metastatic disease. 3. Multiple lytic bone lesions identified in the ribs, sternum, spine, and bony pelvis, consistent with metastatic disease. 4. Hyperattenuating lesion posterior wall distal esophagus, indeterminate. As this was not visible on the noncontrast CT yesterday, the hyperattenuation is related to contrast and is probably oral contrast pooling in a distal esophageal diverticulum or large ulcer. Enhancement from intravascular contrast of a lesion such is vascular malformation is less likely. 5. Small hypoattenuating lesions in the medial spleen, indeterminate. 6.  Aortic Atherosclerois (ICD10-170.0) 7. With the patient has had recent imaging elsewhere, comparison to that earlier imaging would likely  prove helpful. In the absence of recent imaging, PET-CT may be beneficial to further evaluate. Electronically Signed   By: Misty Stanley M.D.   On: 07/16/2017 07:53   US Renal  Result Date: 07/12/2017 CLINICAL DATA:  Sepsis, UTI.  Prior left nephrectomy EXAM: RENAL / URINARY TRACT ULTRASOUND COMPLETE COMPARISON:  None. FINDINGS: Right Kidney: Length: 13.0 cm. Moderate hydronephrosis. No visible mass. Normal echotexture. Left Kidney: Length: Prior left nephrectomy. Bladder: Appears normal for degree of bladder distention. IMPRESSION: Moderate right hydronephrosis. Prior left nephrectomy. Electronically Signed   By: Rolm Baptise M.D.   On: 07/12/2017 12:44   Dg Chest Port 1 View  Result Date: 07/11/2017 CLINICAL DATA:  Weakness for 1 month EXAM: PORTABLE CHEST 1 VIEW COMPARISON:  None. FINDINGS: Cardiac shadow is within normal limits. Aortic calcifications are noted. The lungs are well aerated bilaterally. No focal infiltrate or sizable effusion is seen. Prominent calcification is noted the costal sternal margins of the first ribs bilaterally. IMPRESSION: No acute abnormality noted. Electronically Signed   By: Inez Catalina M.D.   On: 07/11/2017 21:35   Ct Renal Stone Study  Result Date:  07/14/2017 CLINICAL DATA:  Hydronephrosis. Sepsis. Urinary tract infection. History of renal cell carcinoma and left nephrectomy. EXAM: CT ABDOMEN AND PELVIS WITHOUT CONTRAST TECHNIQUE: Multidetector CT imaging of the abdomen and pelvis was performed following the standard protocol without IV contrast. COMPARISON:  Renal ultrasound 07/12/2017 FINDINGS: Lower chest: Minimal atelectasis in the lung bases. No pleural effusion. Extensive coronary artery atherosclerosis. Hepatobiliary: Numerous small hypodense lesions in the liver measuring up to 1.5 cm, incompletely characterized without IV contrast though with the larger lesions not clearly having attenuation of cysts. Unremarkable gallbladder. No significant biliary  dilatation. Pancreas: Small calcifications scattered throughout the pancreas which may reflect the sequelae of chronic pancreatitis. No evidence of significant acute peripancreatic inflammation or ductal dilatation. Spleen: Punctate calcifications in the spleen as well as a poorly defined 8 mm low-density lesion medially. Adrenals/Urinary Tract: 1.7 cm left adrenal nodule (attenuation of 20 HU). Unremarkable right adrenal gland. Prior left nephrectomy. Peripherally calcified retroperitoneal collection extending from the left renal fossa inferiorly over a length of 10 cm, likely old hematoma. 1.5 cm hypodense lesion in the medial interpolar right kidney, likely a cyst. Mild right hydronephrosis. At most minimal dilatation or wall thickening of the proximal right ureter, decompressed distally. Foley catheter in place with decompressed bladder. No urinary tract calculi identified. Stomach/Bowel: Small sliding hiatal hernia. No evidence of bowel obstruction. Prominent sigmoid colon diverticulosis with mild surrounding fat stranding. Unremarkable appendix. Vascular/Lymphatic: Atherosclerosis of the abdominal aorta and its major branch vessels. Prior aortic aneurysm repair, with the infrarenal abdominal aorta measuring up to 3.8 cm in diameter. No enlarged lymph nodes. Reproductive: Unremarkable prostate. Symmetric mild nodularity of the seminal vesicles. Other: No intraperitoneal free fluid. Postsurgical changes in the midline anterior abdominal wall. Musculoskeletal: Multiple small lucent osseous lesions in the pelvis, including a 1.9 cm lesion in the posterior left ilium. 2.7 cm lytic lesion with cortical disruption in the right aspect of the L5 vertebral body. Scattered small sclerotic spine lesions as well. Mild T11 compression fracture with inferior endplate Schmorl's node deformity, likely chronic with a bridging osteophyte noted across the anterior disc space. IMPRESSION: 1. Mild right hydronephrosis without  obstructing stone identified. Upper tract infection is possible. 2. Sigmoid colon diverticulosis with mild surrounding stranding, query mild acute diverticulitis. 3. Prior left nephrectomy. Calcified collection in the left retroperitoneum likely reflects an old hematoma. 4. Multiple osseous lesions, including a 2.7 cm lytic lesion in the L5 vertebral body concerning for metastases. 5. Indeterminate lesions in the liver, spleen, and left adrenal gland. Suggest correlation with prior outside imaging or nonemergent evaluation with contrast-enhanced abdominal MRI (preferably as an outpatient when the patient is able to follow directions and breath hold). 6. Aortic Atherosclerosis (ICD10-I70.0). Prior abdominal aortic aneurysm repair. Electronically Signed   By: Logan Bores M.D.   On: 07/14/2017 12:42    Kerri Asche, DO  [facility] Pager [phone]  If 7PM-7AM, please contact night-coverage [email or web address] Password [email or web address] 07/16/2017, 4:58 PM   LOS: 5 days

## 2017-07-17 DIAGNOSIS — E860 Dehydration: Secondary | ICD-10-CM

## 2017-07-17 DIAGNOSIS — Z66 Do not resuscitate: Secondary | ICD-10-CM

## 2017-07-17 DIAGNOSIS — R310 Gross hematuria: Secondary | ICD-10-CM

## 2017-07-17 LAB — RENAL FUNCTION PANEL
ANION GAP: 11 (ref 5–15)
Albumin: 3.5 g/dL (ref 3.5–5.0)
BUN: 23 mg/dL — ABNORMAL HIGH (ref 6–20)
CHLORIDE: 96 mmol/L — AB (ref 101–111)
CO2: 25 mmol/L (ref 22–32)
Calcium: 9.1 mg/dL (ref 8.9–10.3)
Creatinine, Ser: 0.88 mg/dL (ref 0.61–1.24)
Glucose, Bld: 149 mg/dL — ABNORMAL HIGH (ref 65–99)
POTASSIUM: 3.9 mmol/L (ref 3.5–5.1)
Phosphorus: 3.2 mg/dL (ref 2.5–4.6)
Sodium: 132 mmol/L — ABNORMAL LOW (ref 135–145)

## 2017-07-17 LAB — CBC WITH DIFFERENTIAL/PLATELET
Basophils Absolute: 0 10*3/uL (ref 0.0–0.1)
Basophils Relative: 0 %
EOS ABS: 0.2 10*3/uL (ref 0.0–0.7)
Eosinophils Relative: 1 %
HEMATOCRIT: 36.7 % — AB (ref 39.0–52.0)
HEMOGLOBIN: 12.4 g/dL — AB (ref 13.0–17.0)
LYMPHS ABS: 0.9 10*3/uL (ref 0.7–4.0)
LYMPHS PCT: 6 %
MCH: 33 pg (ref 26.0–34.0)
MCHC: 33.8 g/dL (ref 30.0–36.0)
MCV: 97.6 fL (ref 78.0–100.0)
Monocytes Absolute: 1.2 10*3/uL — ABNORMAL HIGH (ref 0.1–1.0)
Monocytes Relative: 8 %
NEUTROS ABS: 12.2 10*3/uL — AB (ref 1.7–7.7)
NEUTROS PCT: 85 %
Platelets: 134 10*3/uL — ABNORMAL LOW (ref 150–400)
RBC: 3.76 MIL/uL — AB (ref 4.22–5.81)
RDW: 13.5 % (ref 11.5–15.5)
WBC: 14.5 10*3/uL — AB (ref 4.0–10.5)

## 2017-07-17 MED ORDER — MORPHINE SULFATE (PF) 2 MG/ML IV SOLN
2.0000 mg | INTRAVENOUS | Status: DC | PRN
  Administered 2017-07-17: 2 mg via INTRAVENOUS
  Filled 2017-07-17: qty 1

## 2017-07-17 NOTE — Progress Notes (Signed)
PROGRESS NOTE  LEAF KERNODLE HDQ:[id number] DOB: 1932-05-23 DOA: 07/11/2017 PCP: Kendrick Ranch, MD  Brief History:  83 y.o.malewith medical history significant of HTN, PAF not on anticoagulation, CAD, MI in 1986, AAA s/p surgical repair, mild dementia, RCC s/p nephrectomy, and bladder cancer; who presents with complaints of generalized weakness. Symptoms appear to have been progressively worsening over the last 4 weeks after patient had been diagnosed with bronchitis. He notes that he may have been given antibiotics and took them as directed. He has had generalized weakness and has gotten to the point in which he is unable to walk over the last week. Previously falling once or twice per week now every day. He reports his legs just not being able to hold him up.  At the time of admission, the patient was tachycardic with WBC of 14,000.  The patient was started on ceftriaxone for presumptive sepsis from UTI.  He was noted to have right-sided hydronephrosis on CT renal protocol.  Urology was consulted.  A Foley catheter was placed initially with relief of the symptoms.  However after removal of his Foley catheter, the patient's bladder once again became significantly distended.  As a result, urology recommended reinserting the patient's Foley catheter. On 07/17/17, nephrology ordered to remove the foley catheter despite urology recommendations.    On 07/17/17, the patient's plan of care and goals of care were discussed with the patient's spouse and daughter. The patient has had a gradual functional and cognitive decline in the last 6 weeks which is multifactorial including multiple hospitalizations, acute kidney injury, deconditioning, and metastatic cancer.  In addition, the patient has had progressive anorexia with very poor p.o. Intake.  Given the patient's history of dementia, the patient has also experienced a cognitive decline.  Even during this hospitalization, the  patient's functional and cognitive status has gradually declined.  Given his continued decline, he will be a poor candidate for further aggressive medical therapies.  We discussed options including but not limited to full scope of care including liver biopsy and ultimately referral to medical oncologist and follow-up with urologist and discharged to SNF as well as changing the patient's focus of care to focus on his comfort/dignity with ultimate transition to residential hospice.  After extensive discussion, it was mutually agreed that it was in the patient's best interest to transition the patient to residential hospice with focus on his comfort and dignity.    Assessment/Plan: Sepsis -Present at the time of admission -Urine culture negative, but urine culture was done after antibiotics were given -Continued ceftriaxone -WBC improved -Sepsis physiology resolved -After discussion with the patient's daughter and spouse, initial decision was made to transition the patient's focus of care to focus on full comfort.  AKI -Postobstructive would likely a component of volume depletion -Improved  Urinary retention/hematuria/right-sided hydronephrosis -Repeat CT abdomen after Foley catheter was removed showed significant distention of the patient's urinary bladder -07/16/17--Case discussed with urology, Dr. Margret Chance foley-->plan to d/c with foley in place until he follows up in urology office -Dr. Jeffie Pollock doubts R-UPJ obstruction -hematuria likely due to UTI and bladder cancer -continue flomax -07/17/17, nephrology ordered to remove the foley catheter despite urology recommendations.    Liver and bone metastasis/Lung nodules -11/20--CT chest and abdomen shows no external lesions, lytic rib lesions, hyper attenuating liver lesions, and dominant LUL lung nodule -Liver biopsy planned for July 18, 2017 -PSA 0.57 -Outpatient PET scan initially was planned -After discussion with  the  patient's daughter and spouse, initial decision was made to transition the patient's focus of care to focus on full comfort.  Atrial fibrillation, type unspecified -Rate controlled -Continue carvedilol -not AC candidate due to frequent falls -CHADS-VASc = 4  Hyponatremia -Multifactorial including poor solute intake, Lexapro, and possible component of SIADH -Overall stable  Essential hypertension -Continue carvedilol  Dementia without behavioral disturbance -Continue Aricept   Hyperlipidemia -Continue statin  Goals of Care -11/22--had a long discussion with the patient's daughter and spouse at the bedside -The patient has had a gradual functional and cognitive decline in the last 6 weeks which is multifactorial including multiple hospitalizations, acute kidney injury, deconditioning, and metastatic cancer.  In addition, the patient has had progressive anorexia with very poor p.o. Intake.  Given the patient's history of dementia, the patient has also experienced a cognitive decline.  Even during this hospitalization, the patient's functional and cognitive status has gradually declined.  Given his continued decline, he will be a poor candidate for further aggressive medical therapies.  We discussed options including but not limited to full scope of care including liver biopsy and ultimately referral to medical oncologist and follow-up with urologist and discharged to SNF as well as changing the patient's focus of care to focus on his comfort/dignity with ultimate transition to residential hospice.  After extensive discussion, it was mutually agreed that it was in the patient's best interest to transition the patient to residential hospice with focus on his comfort and dignity. Overall prognosis approximately < 1 month.      Disposition Plan:  Residential hospice when bed available  Family Communication:   Family at bedside--Total time spent 40 minutes.  Greater than 50% spent face  to face counseling and coordinating care.   Consultants:  Urology, nephrology  Code Status:  FULL COMFORT  DVT Prophylaxis:  FULL COMFORT   Procedures: As Listed in Progress Note Above  Antibiotics: Ceftriaxone 11/17>>>11/21    Subjective: Patient is pleasantly confused.  He denies any headache, chest pain, shortness breath, abdominal pain.  There is no reports of vomiting, diarrhea, respiratory distress.  Objective: Vitals:   07/16/17 2055 07/17/17 0311 07/17/17 0610 07/17/17 1320  BP: 122/63 (!) 130/91 138/71 128/66  Pulse: 97 88 63 65  Resp: 18 18  18   Temp: 99.3 F (37.4 C) 98.4 F (36.9 C)  98.3 F (36.8 C)  TempSrc: Oral Oral  Oral  SpO2: 98% 95%  95%  Weight:      Height:        Intake/Output Summary (Last 24 hours) at 07/17/2017 1612 Last data filed at 07/17/2017 1326 Gross per 24 hour  Intake 1033.33 ml  Output 1650 ml  Net -616.67 ml   Weight change:  Exam:   General:  Pt is alert, follows commands appropriately, not in acute distress  HEENT: No icterus, No thrush, No neck mass, [place]/AT  Cardiovascular: RRR, S1/S2, no rubs, no gallops  Respiratory: Bibasilar crackles but no wheezing.  Good air movement.  Abdomen: Soft/+BS, non tender, non distended, no guarding  Extremities: No edema, No lymphangitis, No petechiae, No rashes, no synovitis   Data Reviewed: I have personally reviewed following labs and imaging studies Basic Metabolic Panel: Recent Labs  Lab 07/12/17 0654 07/13/17 0647 07/14/17 0609 07/15/17 0433 07/16/17 0404 07/17/17 0451  NA 130* 131* 133* 133* 131* 132*  K 3.7 3.6 3.2* 4.0 3.9 3.9  CL 95* 98* 98* 99* 97* 96*  CO2 22 21* 23 24 24  25  GLUCOSE 117* 130* 130* 133* 134* 149*  BUN 28* 44* 28* 24* 22* 23*  CREATININE 1.17 2.55* 1.24 1.05 0.84 0.88  CALCIUM 9.6 8.8* 8.7* 8.9 9.0 9.1  MG 2.5*  --   --   --   --   --   PHOS  --   --  3.0 2.5 3.0 3.2   Liver Function Tests: Recent Labs  Lab 07/11/17 2127  07/14/17 0609 07/15/17 0433 07/16/17 0404 07/17/17 0451  AST 46*  --   --   --   --   ALT 57  --   --   --   --   ALKPHOS 150*  --   --   --   --   BILITOT 1.6*  --   --   --   --   PROT 7.6  --   --   --   --   ALBUMIN 4.5 3.6 3.6 3.5 3.5   No results for input(s): LIPASE, AMYLASE in the last 168 hours. No results for input(s): AMMONIA in the last 168 hours. Coagulation Profile: Recent Labs  Lab 07/12/17 0654  INR 1.02   CBC: Recent Labs  Lab 07/11/17 2127  07/13/17 0647 07/14/17 0609 07/15/17 0433 07/16/17 0404 07/17/17 0451  WBC 14.1*   < > 12.4* 10.7* 10.2 10.8* 14.5*  NEUTROABS 11.7*  --   --  8.3* 7.4 8.4* 12.2*  HGB 13.2   < > 12.2* 11.9* 12.2* 12.2* 12.4*  HCT 38.8*   < > 35.6* 34.0* 35.7* 36.3* 36.7*  MCV 96.8   < > 95.7 95.2 97.3 98.1 97.6  PLT 177   < > 156 160 159 148* 134*   < > = values in this interval not displayed.   Cardiac Enzymes: Recent Labs  Lab 07/11/17 2127  TROPONINI <0.03   BNP: Invalid input(s): POCBNP CBG: Recent Labs  Lab 07/12/17 0816 07/12/17 1143 07/12/17 1631 07/15/17 2053 07/16/17 0744  GLUCAP 113* 184* 109* 149* 125*   HbA1C: No results for input(s): HGBA1C in the last 72 hours. Urine analysis:    Component Value Date/Time   COLORURINE YELLOW 07/11/2017 2240   APPEARANCEUR CLOUDY (A) 07/11/2017 2240   LABSPEC 1.014 07/11/2017 2240   PHURINE 5.0 07/11/2017 2240   GLUCOSEU NEGATIVE 07/11/2017 2240   HGBUR LARGE (A) 07/11/2017 2240   BILIRUBINUR NEGATIVE 07/11/2017 2240   KETONESUR 5 (A) 07/11/2017 2240   PROTEINUR 100 (A) 07/11/2017 2240   NITRITE NEGATIVE 07/11/2017 2240   LEUKOCYTESUR NEGATIVE 07/11/2017 2240   Sepsis Labs: @LABRCNTIP (procalcitonin:4,lacticidven:4) ) Recent Results (from the past 240 hour(s))  Urine Culture     Status: None   Collection Time: 07/12/17  8:30 AM  Result Value Ref Range Status   Specimen Description URINE, CLEAN CATCH  Final   Special Requests NONE  Final   Culture    Final    NO GROWTH Performed at [facility], [place] [address] [place], [place] [address]    Report Status 07/14/2017 FINAL  Final     Scheduled Meds: . amLODipine  10 mg Oral Daily  . aspirin EC  81 mg Oral Daily  . atorvastatin  20 mg Oral q1800  . carvedilol  12.5 mg Oral BID WC  . donepezil  5 mg Oral QHS  . escitalopram  10 mg Oral Daily  . feeding supplement (ENSURE ENLIVE)  237 mL Oral BID BM  . tamsulosin  0.4 mg Oral QPC breakfast   Continuous Infusions: .  sodium chloride Stopped (07/17/17 0950)  . cefTRIAXone (ROCEPHIN)  IV Stopped (07/17/17 0003)    Procedures/Studies: Ct Head Wo Contrast  Result Date: 07/11/2017 CLINICAL DATA:  83 year old male with generalized weakness. EXAM: CT HEAD WITHOUT CONTRAST TECHNIQUE: Contiguous axial images were obtained from the base of the skull through the vertex without intravenous contrast. COMPARISON:  None. FINDINGS: Brain: There is moderate age-related atrophy and chronic microvascular ischemic changes. Small left lentiform nucleus old lacunar infarct. There is no acute intracranial hemorrhage. No mass effect midline shift noted. No extra-axial fluid collection. Vascular: There is atherosclerotic calcification of the cavernous ICA and right MCA. Atherosclerotic calcification of the right vertebral artery. Skull: Normal. Negative for fracture or focal lesion. Sinuses/Orbits: No acute finding. Other: Bilateral cataract surgeries. IMPRESSION: 1. No acute intracranial hemorrhage. 2. Moderate age-related atrophy and chronic microvascular ischemic changes. If symptoms persist, and there are no contraindications, MRI may provide better evaluation if clinically indicated. Electronically Signed   By: Anner Crete M.D.   On: 07/11/2017 22:15   Ct Chest W Contrast  Result Date: 07/16/2017 CLINICAL DATA:  Bladder cancer and history of left nephrectomy for renal cell carcinoma. EXAM: CT CHEST, ABDOMEN, AND PELVIS WITH CONTRAST TECHNIQUE:  Multidetector CT imaging of the chest, abdomen and pelvis was performed following the standard protocol during bolus administration of intravenous contrast. CONTRAST:  180mL ISOVUE-300 IOPAMIDOL (ISOVUE-300) INJECTION 61% COMPARISON:  Noncontrast abdomen and pelvis CT 07/14/2017 FINDINGS: CT CHEST FINDINGS Cardiovascular: Heart size upper normal. Coronary artery calcification is evident. Atherosclerotic calcification is noted in the wall of the thoracic aorta. Mediastinum/Nodes: 8 mm short axis subcarinal lymph node is upper normal. 8 mm short axis lymph node identified in the left hilum. There is abnormal soft tissue in left hilum, circumferentially encasing the left upper lobe bronchus. Calcified nodal tissue is seen in the left hilum. There is an area of hyper enhancement in the posterior wall the distal esophagus (see image 48 series 2). Lungs/Pleura: 2.0 x 2.9 cm nodule is identified in the left upper lobe. 3 mm uncalcified right upper lobe nodule is seen on image 61 in the posterior right upper lobe noncalcified 3-4 mm nodule is seen on image 47. There is some trace atelectasis in the lung bases. Small calcified granuloma identified right lung. Musculoskeletal: Multiple lytic bone lesions are identified including sternum (image 53), posterior right fourth rib, lateral right eighth rib, and lateral left fifth rib. CT ABDOMEN PELVIS FINDINGS Hepatobiliary: Multiple ill-defined hypoattenuating liver lesions are identified involving both hepatic lobes. Index lesion posterior right liver measures 11 mm on image 53 series 2. There is no evidence for gallstones, gallbladder wall thickening, or pericholecystic fluid. No intrahepatic or extrahepatic biliary dilation. Pancreas: No focal mass lesion. No dilatation of the main duct. No intraparenchymal cyst. No peripancreatic edema. Spleen: Calcified granulomata noted in the spleen. Small hypoattenuating lesions in the medial spleen and measure up to 12 mm.  Adrenals/Urinary Tract: Thickening of the adrenal glands noted without discrete nodule or mass. Left kidney surgically absent. Rim calcified lesion posterior left retroperitoneal space likely postsurgical in may represent chronic hematoma. Mild right hydronephrosis noted with probable 13 mm cyst interpolar right kidney. The degree of collecting system distention seems out of proportion to the degree of right ureteral fullness suggesting a component of right UPJ obstruction. Bladder is moderately distended. Stomach/Bowel: Stomach is nondistended. No gastric wall thickening. No evidence of outlet obstruction. Duodenum is normally positioned as is the ligament of Treitz. No small bowel wall thickening. No  small bowel dilatation. The terminal ileum is normal. The appendix is normal. Diverticular changes are noted in the left colon without evidence of diverticulitis. Vascular/Lymphatic: Status post aorto bi-iliac bypass grafting. Upper normal lymph nodes are seen in the hepatoduodenal ligament. Small retroperitoneal lymph nodes identified. No pelvic sidewall lymphadenopathy. Reproductive: Central TURP defect identified in the prostate gland. Other: No intraperitoneal free fluid. Musculoskeletal: Multiple lytic bone lesions are identified including 2.9 cm lesion right L5 vertebral body. Several small lytic lesions are seen in the right iliac bone. IMPRESSION: 1. 2.9 cm left upper lobe pulmonary nodule. This is associated with left hilar lymphadenopathy. Imaging features may be related to metastatic disease in this patient with a history of renal and bladder cancer. Primary bronchogenic neoplasm is also consideration. 2. Multiple hypoenhancing lesions scattered through both hepatic lobes consistent with metastatic disease. 3. Multiple lytic bone lesions identified in the ribs, sternum, spine, and bony pelvis, consistent with metastatic disease. 4. Hyperattenuating lesion posterior wall distal esophagus, indeterminate. As  this was not visible on the noncontrast CT yesterday, the hyperattenuation is related to contrast and is probably oral contrast pooling in a distal esophageal diverticulum or large ulcer. Enhancement from intravascular contrast of a lesion such is vascular malformation is less likely. 5. Small hypoattenuating lesions in the medial spleen, indeterminate. 6.  Aortic Atherosclerois (ICD10-170.0) 7. With the patient has had recent imaging elsewhere, comparison to that earlier imaging would likely prove helpful. In the absence of recent imaging, PET-CT may be beneficial to further evaluate. Electronically Signed   By: Misty Stanley M.D.   On: 07/16/2017 07:53   Ct Abdomen Pelvis W Contrast  Result Date: 07/16/2017 CLINICAL DATA:  Bladder cancer and history of left nephrectomy for renal cell carcinoma. EXAM: CT CHEST, ABDOMEN, AND PELVIS WITH CONTRAST TECHNIQUE: Multidetector CT imaging of the chest, abdomen and pelvis was performed following the standard protocol during bolus administration of intravenous contrast. CONTRAST:  173mL ISOVUE-300 IOPAMIDOL (ISOVUE-300) INJECTION 61% COMPARISON:  Noncontrast abdomen and pelvis CT 07/14/2017 FINDINGS: CT CHEST FINDINGS Cardiovascular: Heart size upper normal. Coronary artery calcification is evident. Atherosclerotic calcification is noted in the wall of the thoracic aorta. Mediastinum/Nodes: 8 mm short axis subcarinal lymph node is upper normal. 8 mm short axis lymph node identified in the left hilum. There is abnormal soft tissue in left hilum, circumferentially encasing the left upper lobe bronchus. Calcified nodal tissue is seen in the left hilum. There is an area of hyper enhancement in the posterior wall the distal esophagus (see image 48 series 2). Lungs/Pleura: 2.0 x 2.9 cm nodule is identified in the left upper lobe. 3 mm uncalcified right upper lobe nodule is seen on image 61 in the posterior right upper lobe noncalcified 3-4 mm nodule is seen on image 47. There  is some trace atelectasis in the lung bases. Small calcified granuloma identified right lung. Musculoskeletal: Multiple lytic bone lesions are identified including sternum (image 53), posterior right fourth rib, lateral right eighth rib, and lateral left fifth rib. CT ABDOMEN PELVIS FINDINGS Hepatobiliary: Multiple ill-defined hypoattenuating liver lesions are identified involving both hepatic lobes. Index lesion posterior right liver measures 11 mm on image 53 series 2. There is no evidence for gallstones, gallbladder wall thickening, or pericholecystic fluid. No intrahepatic or extrahepatic biliary dilation. Pancreas: No focal mass lesion. No dilatation of the main duct. No intraparenchymal cyst. No peripancreatic edema. Spleen: Calcified granulomata noted in the spleen. Small hypoattenuating lesions in the medial spleen and measure up to 12 mm. Adrenals/Urinary  Tract: Thickening of the adrenal glands noted without discrete nodule or mass. Left kidney surgically absent. Rim calcified lesion posterior left retroperitoneal space likely postsurgical in may represent chronic hematoma. Mild right hydronephrosis noted with probable 13 mm cyst interpolar right kidney. The degree of collecting system distention seems out of proportion to the degree of right ureteral fullness suggesting a component of right UPJ obstruction. Bladder is moderately distended. Stomach/Bowel: Stomach is nondistended. No gastric wall thickening. No evidence of outlet obstruction. Duodenum is normally positioned as is the ligament of Treitz. No small bowel wall thickening. No small bowel dilatation. The terminal ileum is normal. The appendix is normal. Diverticular changes are noted in the left colon without evidence of diverticulitis. Vascular/Lymphatic: Status post aorto bi-iliac bypass grafting. Upper normal lymph nodes are seen in the hepatoduodenal ligament. Small retroperitoneal lymph nodes identified. No pelvic sidewall lymphadenopathy.  Reproductive: Central TURP defect identified in the prostate gland. Other: No intraperitoneal free fluid. Musculoskeletal: Multiple lytic bone lesions are identified including 2.9 cm lesion right L5 vertebral body. Several small lytic lesions are seen in the right iliac bone. IMPRESSION: 1. 2.9 cm left upper lobe pulmonary nodule. This is associated with left hilar lymphadenopathy. Imaging features may be related to metastatic disease in this patient with a history of renal and bladder cancer. Primary bronchogenic neoplasm is also consideration. 2. Multiple hypoenhancing lesions scattered through both hepatic lobes consistent with metastatic disease. 3. Multiple lytic bone lesions identified in the ribs, sternum, spine, and bony pelvis, consistent with metastatic disease. 4. Hyperattenuating lesion posterior wall distal esophagus, indeterminate. As this was not visible on the noncontrast CT yesterday, the hyperattenuation is related to contrast and is probably oral contrast pooling in a distal esophageal diverticulum or large ulcer. Enhancement from intravascular contrast of a lesion such is vascular malformation is less likely. 5. Small hypoattenuating lesions in the medial spleen, indeterminate. 6.  Aortic Atherosclerois (ICD10-170.0) 7. With the patient has had recent imaging elsewhere, comparison to that earlier imaging would likely prove helpful. In the absence of recent imaging, PET-CT may be beneficial to further evaluate. Electronically Signed   By: Misty Stanley M.D.   On: 07/16/2017 07:53   US Renal  Result Date: 07/12/2017 CLINICAL DATA:  Sepsis, UTI.  Prior left nephrectomy EXAM: RENAL / URINARY TRACT ULTRASOUND COMPLETE COMPARISON:  None. FINDINGS: Right Kidney: Length: 13.0 cm. Moderate hydronephrosis. No visible mass. Normal echotexture. Left Kidney: Length: Prior left nephrectomy. Bladder: Appears normal for degree of bladder distention. IMPRESSION: Moderate right hydronephrosis. Prior left  nephrectomy. Electronically Signed   By: Rolm Baptise M.D.   On: 07/12/2017 12:44   Dg Chest Port 1 View  Result Date: 07/11/2017 CLINICAL DATA:  Weakness for 1 month EXAM: PORTABLE CHEST 1 VIEW COMPARISON:  None. FINDINGS: Cardiac shadow is within normal limits. Aortic calcifications are noted. The lungs are well aerated bilaterally. No focal infiltrate or sizable effusion is seen. Prominent calcification is noted the costal sternal margins of the first ribs bilaterally. IMPRESSION: No acute abnormality noted. Electronically Signed   By: Inez Catalina M.D.   On: 07/11/2017 21:35   Ct Renal Stone Study  Result Date: 07/14/2017 CLINICAL DATA:  Hydronephrosis. Sepsis. Urinary tract infection. History of renal cell carcinoma and left nephrectomy. EXAM: CT ABDOMEN AND PELVIS WITHOUT CONTRAST TECHNIQUE: Multidetector CT imaging of the abdomen and pelvis was performed following the standard protocol without IV contrast. COMPARISON:  Renal ultrasound 07/12/2017 FINDINGS: Lower chest: Minimal atelectasis in the lung bases. No pleural effusion.  Extensive coronary artery atherosclerosis. Hepatobiliary: Numerous small hypodense lesions in the liver measuring up to 1.5 cm, incompletely characterized without IV contrast though with the larger lesions not clearly having attenuation of cysts. Unremarkable gallbladder. No significant biliary dilatation. Pancreas: Small calcifications scattered throughout the pancreas which may reflect the sequelae of chronic pancreatitis. No evidence of significant acute peripancreatic inflammation or ductal dilatation. Spleen: Punctate calcifications in the spleen as well as a poorly defined 8 mm low-density lesion medially. Adrenals/Urinary Tract: 1.7 cm left adrenal nodule (attenuation of 20 HU). Unremarkable right adrenal gland. Prior left nephrectomy. Peripherally calcified retroperitoneal collection extending from the left renal fossa inferiorly over a length of 10 cm, likely old  hematoma. 1.5 cm hypodense lesion in the medial interpolar right kidney, likely a cyst. Mild right hydronephrosis. At most minimal dilatation or wall thickening of the proximal right ureter, decompressed distally. Foley catheter in place with decompressed bladder. No urinary tract calculi identified. Stomach/Bowel: Small sliding hiatal hernia. No evidence of bowel obstruction. Prominent sigmoid colon diverticulosis with mild surrounding fat stranding. Unremarkable appendix. Vascular/Lymphatic: Atherosclerosis of the abdominal aorta and its major branch vessels. Prior aortic aneurysm repair, with the infrarenal abdominal aorta measuring up to 3.8 cm in diameter. No enlarged lymph nodes. Reproductive: Unremarkable prostate. Symmetric mild nodularity of the seminal vesicles. Other: No intraperitoneal free fluid. Postsurgical changes in the midline anterior abdominal wall. Musculoskeletal: Multiple small lucent osseous lesions in the pelvis, including a 1.9 cm lesion in the posterior left ilium. 2.7 cm lytic lesion with cortical disruption in the right aspect of the L5 vertebral body. Scattered small sclerotic spine lesions as well. Mild T11 compression fracture with inferior endplate Schmorl's node deformity, likely chronic with a bridging osteophyte noted across the anterior disc space. IMPRESSION: 1. Mild right hydronephrosis without obstructing stone identified. Upper tract infection is possible. 2. Sigmoid colon diverticulosis with mild surrounding stranding, query mild acute diverticulitis. 3. Prior left nephrectomy. Calcified collection in the left retroperitoneum likely reflects an old hematoma. 4. Multiple osseous lesions, including a 2.7 cm lytic lesion in the L5 vertebral body concerning for metastases. 5. Indeterminate lesions in the liver, spleen, and left adrenal gland. Suggest correlation with prior outside imaging or nonemergent evaluation with contrast-enhanced abdominal MRI (preferably as an outpatient  when the patient is able to follow directions and breath hold). 6. Aortic Atherosclerosis (ICD10-I70.0). Prior abdominal aortic aneurysm repair. Electronically Signed   By: Logan Bores M.D.   On: 07/14/2017 12:42    Lalania Haseman, DO  [facility] Pager [phone]  If 7PM-7AM, please contact night-coverage [email or web address] Password [email or web address] 07/17/2017, 4:12 PM   LOS: 6 days

## 2017-07-17 NOTE — Progress Notes (Signed)
Subjective: Interval History: Patient offers no complaints.  He does not have any nausea or vomiting.  His appetite is good  Objective: Vital signs in last 24 hours: Temp:  [98.4 F (36.9 C)-99.3 F (37.4 C)] 98.4 F (36.9 C) (11/22 0311) Pulse Rate:  [63-97] 63 (11/22 0610) Resp:  [18] 18 (11/22 0311) BP: (122-138)/(63-91) 138/71 (11/22 0610) SpO2:  [95 %-98 %] 95 % (11/22 0311) Weight change:   Intake/Output from previous day: 11/21 0701 - 11/22 0700 In: 577.3 [P.O.:120; I.V.:407.3; IV Piggyback:50] Out: 1610 [Urine:1650] Intake/Output this shift: No intake/output data recorded.  General appearance: alert, cooperative and no distress Resp: clear to auscultation bilaterally Cardio: regular rate and rhythm Extremities: No edema  Lab Results: Recent Labs    07/16/17 0404 07/17/17 0451  WBC 10.8* 14.5*  HGB 12.2* 12.4*  HCT 36.3* 36.7*  PLT 148* 134*   BMET:  Recent Labs    07/16/17 0404 07/17/17 0451  NA 131* 132*  K 3.9 3.9  CL 97* 96*  CO2 24 25  GLUCOSE 134* 149*  BUN 22* 23*  CREATININE 0.84 0.88  CALCIUM 9.0 9.1   No results for input(s): PTH in the last 72 hours. Iron Studies: No results for input(s): IRON, TIBC, TRANSFERRIN, FERRITIN in the last 72 hours.  Studies/Results: Ct Chest W Contrast  Result Date: 07/16/2017 CLINICAL DATA:  Bladder cancer and history of left nephrectomy for renal cell carcinoma. EXAM: CT CHEST, ABDOMEN, AND PELVIS WITH CONTRAST TECHNIQUE: Multidetector CT imaging of the chest, abdomen and pelvis was performed following the standard protocol during bolus administration of intravenous contrast. CONTRAST:  130mL ISOVUE-300 IOPAMIDOL (ISOVUE-300) INJECTION 61% COMPARISON:  Noncontrast abdomen and pelvis CT 07/14/2017 FINDINGS: CT CHEST FINDINGS Cardiovascular: Heart size upper normal. Coronary artery calcification is evident. Atherosclerotic calcification is noted in the wall of the thoracic aorta. Mediastinum/Nodes: 8 mm short  axis subcarinal lymph node is upper normal. 8 mm short axis lymph node identified in the left hilum. There is abnormal soft tissue in left hilum, circumferentially encasing the left upper lobe bronchus. Calcified nodal tissue is seen in the left hilum. There is an area of hyper enhancement in the posterior wall the distal esophagus (see image 48 series 2). Lungs/Pleura: 2.0 x 2.9 cm nodule is identified in the left upper lobe. 3 mm uncalcified right upper lobe nodule is seen on image 61 in the posterior right upper lobe noncalcified 3-4 mm nodule is seen on image 47. There is some trace atelectasis in the lung bases. Small calcified granuloma identified right lung. Musculoskeletal: Multiple lytic bone lesions are identified including sternum (image 53), posterior right fourth rib, lateral right eighth rib, and lateral left fifth rib. CT ABDOMEN PELVIS FINDINGS Hepatobiliary: Multiple ill-defined hypoattenuating liver lesions are identified involving both hepatic lobes. Index lesion posterior right liver measures 11 mm on image 53 series 2. There is no evidence for gallstones, gallbladder wall thickening, or pericholecystic fluid. No intrahepatic or extrahepatic biliary dilation. Pancreas: No focal mass lesion. No dilatation of the main duct. No intraparenchymal cyst. No peripancreatic edema. Spleen: Calcified granulomata noted in the spleen. Small hypoattenuating lesions in the medial spleen and measure up to 12 mm. Adrenals/Urinary Tract: Thickening of the adrenal glands noted without discrete nodule or mass. Left kidney surgically absent. Rim calcified lesion posterior left retroperitoneal space likely postsurgical in may represent chronic hematoma. Mild right hydronephrosis noted with probable 13 mm cyst interpolar right kidney. The degree of collecting system distention seems out of proportion to the  degree of right ureteral fullness suggesting a component of right UPJ obstruction. Bladder is moderately  distended. Stomach/Bowel: Stomach is nondistended. No gastric wall thickening. No evidence of outlet obstruction. Duodenum is normally positioned as is the ligament of Treitz. No small bowel wall thickening. No small bowel dilatation. The terminal ileum is normal. The appendix is normal. Diverticular changes are noted in the left colon without evidence of diverticulitis. Vascular/Lymphatic: Status post aorto bi-iliac bypass grafting. Upper normal lymph nodes are seen in the hepatoduodenal ligament. Small retroperitoneal lymph nodes identified. No pelvic sidewall lymphadenopathy. Reproductive: Central TURP defect identified in the prostate gland. Other: No intraperitoneal free fluid. Musculoskeletal: Multiple lytic bone lesions are identified including 2.9 cm lesion right L5 vertebral body. Several small lytic lesions are seen in the right iliac bone. IMPRESSION: 1. 2.9 cm left upper lobe pulmonary nodule. This is associated with left hilar lymphadenopathy. Imaging features may be related to metastatic disease in this patient with a history of renal and bladder cancer. Primary bronchogenic neoplasm is also consideration. 2. Multiple hypoenhancing lesions scattered through both hepatic lobes consistent with metastatic disease. 3. Multiple lytic bone lesions identified in the ribs, sternum, spine, and bony pelvis, consistent with metastatic disease. 4. Hyperattenuating lesion posterior wall distal esophagus, indeterminate. As this was not visible on the noncontrast CT yesterday, the hyperattenuation is related to contrast and is probably oral contrast pooling in a distal esophageal diverticulum or large ulcer. Enhancement from intravascular contrast of a lesion such is vascular malformation is less likely. 5. Small hypoattenuating lesions in the medial spleen, indeterminate. 6.  Aortic Atherosclerois (ICD10-170.0) 7. With the patient has had recent imaging elsewhere, comparison to that earlier imaging would likely  prove helpful. In the absence of recent imaging, PET-CT may be beneficial to further evaluate. Electronically Signed   By: Misty Stanley M.D.   On: 07/16/2017 07:53   Ct Abdomen Pelvis W Contrast  Result Date: 07/16/2017 CLINICAL DATA:  Bladder cancer and history of left nephrectomy for renal cell carcinoma. EXAM: CT CHEST, ABDOMEN, AND PELVIS WITH CONTRAST TECHNIQUE: Multidetector CT imaging of the chest, abdomen and pelvis was performed following the standard protocol during bolus administration of intravenous contrast. CONTRAST:  152mL ISOVUE-300 IOPAMIDOL (ISOVUE-300) INJECTION 61% COMPARISON:  Noncontrast abdomen and pelvis CT 07/14/2017 FINDINGS: CT CHEST FINDINGS Cardiovascular: Heart size upper normal. Coronary artery calcification is evident. Atherosclerotic calcification is noted in the wall of the thoracic aorta. Mediastinum/Nodes: 8 mm short axis subcarinal lymph node is upper normal. 8 mm short axis lymph node identified in the left hilum. There is abnormal soft tissue in left hilum, circumferentially encasing the left upper lobe bronchus. Calcified nodal tissue is seen in the left hilum. There is an area of hyper enhancement in the posterior wall the distal esophagus (see image 48 series 2). Lungs/Pleura: 2.0 x 2.9 cm nodule is identified in the left upper lobe. 3 mm uncalcified right upper lobe nodule is seen on image 61 in the posterior right upper lobe noncalcified 3-4 mm nodule is seen on image 47. There is some trace atelectasis in the lung bases. Small calcified granuloma identified right lung. Musculoskeletal: Multiple lytic bone lesions are identified including sternum (image 53), posterior right fourth rib, lateral right eighth rib, and lateral left fifth rib. CT ABDOMEN PELVIS FINDINGS Hepatobiliary: Multiple ill-defined hypoattenuating liver lesions are identified involving both hepatic lobes. Index lesion posterior right liver measures 11 mm on image 53 series 2. There is no evidence  for gallstones, gallbladder wall thickening, or  pericholecystic fluid. No intrahepatic or extrahepatic biliary dilation. Pancreas: No focal mass lesion. No dilatation of the main duct. No intraparenchymal cyst. No peripancreatic edema. Spleen: Calcified granulomata noted in the spleen. Small hypoattenuating lesions in the medial spleen and measure up to 12 mm. Adrenals/Urinary Tract: Thickening of the adrenal glands noted without discrete nodule or mass. Left kidney surgically absent. Rim calcified lesion posterior left retroperitoneal space likely postsurgical in may represent chronic hematoma. Mild right hydronephrosis noted with probable 13 mm cyst interpolar right kidney. The degree of collecting system distention seems out of proportion to the degree of right ureteral fullness suggesting a component of right UPJ obstruction. Bladder is moderately distended. Stomach/Bowel: Stomach is nondistended. No gastric wall thickening. No evidence of outlet obstruction. Duodenum is normally positioned as is the ligament of Treitz. No small bowel wall thickening. No small bowel dilatation. The terminal ileum is normal. The appendix is normal. Diverticular changes are noted in the left colon without evidence of diverticulitis. Vascular/Lymphatic: Status post aorto bi-iliac bypass grafting. Upper normal lymph nodes are seen in the hepatoduodenal ligament. Small retroperitoneal lymph nodes identified. No pelvic sidewall lymphadenopathy. Reproductive: Central TURP defect identified in the prostate gland. Other: No intraperitoneal free fluid. Musculoskeletal: Multiple lytic bone lesions are identified including 2.9 cm lesion right L5 vertebral body. Several small lytic lesions are seen in the right iliac bone. IMPRESSION: 1. 2.9 cm left upper lobe pulmonary nodule. This is associated with left hilar lymphadenopathy. Imaging features may be related to metastatic disease in this patient with a history of renal and bladder cancer.  Primary bronchogenic neoplasm is also consideration. 2. Multiple hypoenhancing lesions scattered through both hepatic lobes consistent with metastatic disease. 3. Multiple lytic bone lesions identified in the ribs, sternum, spine, and bony pelvis, consistent with metastatic disease. 4. Hyperattenuating lesion posterior wall distal esophagus, indeterminate. As this was not visible on the noncontrast CT yesterday, the hyperattenuation is related to contrast and is probably oral contrast pooling in a distal esophageal diverticulum or large ulcer. Enhancement from intravascular contrast of a lesion such is vascular malformation is less likely. 5. Small hypoattenuating lesions in the medial spleen, indeterminate. 6.  Aortic Atherosclerois (ICD10-170.0) 7. With the patient has had recent imaging elsewhere, comparison to that earlier imaging would likely prove helpful. In the absence of recent imaging, PET-CT may be beneficial to further evaluate. Electronically Signed   By: Misty Stanley M.D.   On: 07/16/2017 07:53    I have reviewed the patient's current medications.  Assessment/Plan: Problem #1 acute kidney injury: possibly secondary to obstructive uropathy/AC Einhibitor/prerenal..  His renal function has recovered.  Patient remains nonoliguric  2 hypokalemia: His potassium is normal. 3] low CO2 possibly metabolic has improved 4] history of renal and bladder CA 5] UTI 6] hematuria: At this moment etiology not clear.  Possibly from Foley catheter insertion.  However in a patient with renal and bladder CA recurrence of the disease need to be entertained. 7] history of CVA Plan: 1]We will DC Foley catheter. 2] since his renal function has recovered I will sign off.  Thank you for letting me participate in his care.  Patient advised to keep his appointment with urology.  LOS: 6 days   Geo Slone S 07/17/2017,8:42 AM

## 2017-07-18 MED ORDER — LORAZEPAM 2 MG/ML PO CONC: 1.0000 mg | ORAL | 0 refills | Status: AC | PRN

## 2017-07-18 MED ORDER — MORPHINE SULFATE (CONCENTRATE) 10 MG /0.5 ML PO SOLN: 4.0000 mg | ORAL | 0 refills | Status: AC | PRN

## 2017-07-18 NOTE — Discharge Summary (Signed)
Physician Discharge Summary  Ricky Blankenship:[id number] DOB: [birth year] DOA: 07/11/2017  PCP: Kendrick Ranch, MD  Admit date: 07/11/2017 Discharge date: 07/18/2017  Admitted From: Home Disposition:  Residential Hospice    Discharge Condition: Stable CODE STATUS: FULL COMFORT Diet recommendation: Regular   Brief/Interim Summary: 83 y.o.malewith medical history significant of HTN, PAF not on anticoagulation, CAD, MI in 1986, AAA s/p surgical repair, mild dementia, RCC s/p nephrectomy, and bladder cancer; who presents with complaints of generalized weakness. Symptoms appear to have been progressively worsening over the last 4 weeks after patient had been diagnosed with bronchitis. He notes that he may have been given antibiotics and took them as directed. He has had generalized weakness and has gotten to the point in which he is unable to walk over the last week. Previously falling once or twice per week now every day. He reports his legs just not being able to hold him up.At the time of admission, the patient was tachycardic with WBC of 14,000. The patient was started on ceftriaxone for presumptive sepsis from UTI. He was noted to have right-sided hydronephrosis on CT renal protocol. Urology was consulted. A Foley catheter was placed initially with relief of the symptoms. However after removal of his Foley catheter, the patient's bladder once again became significantly distended. As a result, urology recommended reinserting the patient's Foley catheter. On 07/17/17, nephrology ordered to remove the foley catheter despite urology recommendations.    On 07/17/17, the patient's plan of care and goals of care were discussed with the patient's spouse and daughter. The patient has had a gradual functional and cognitive decline in the last 6 weeks which is multifactorial including multiple hospitalizations, acute kidney injury, deconditioning, and metastatic cancer.  In  addition, the patient has had progressive anorexia with very poor p.o. Intake.  Given the patient's history of dementia, the patient has also experienced a cognitive decline.  Even during this hospitalization, the patient's functional and cognitive status has gradually declined.  Given his continued decline, he will be a poor candidate for further aggressive medical therapies.  We discussed options including but not limited to full scope of care including liver biopsy and ultimately referral to medical oncologist and follow-up with urologist and discharged to SNF as well as changing the patient's focus of care to focus on his comfort/dignity with ultimate transition to residential hospice.    His daughter and wife expressed that they did not want any further invasive procedures.  After extensive discussion, it was mutually agreed that it was in the patient's best interest to transition the patient to residential hospice with focus on his comfort and dignity. On July 18, 2017, I had another conversation with the patient's wife.  She continue to reaffirm that she does not want to pursue a liver biopsy for her husband.  She continues to be agreeable for residential hospice as long as Medicare provides benefits.  Social work  was consulted and facilitated transition to residential hospice.     Discharge Diagnoses:  Sepsis -Present at the time of admission -Urine culture negative, but urine culture was done after antibiotics were given -Continued ceftriaxone--pt received 5 days -WBC improved -Sepsis physiology resolved -After discussion with the patient's daughter and spouse, initial decision was made to transition the patient's focus of care to focus on full comfort.  AKI -Postobstructive would likely a component of volume depletion -Improved  Urinary retention/hematuria/right-sided hydronephrosis -Repeat CT abdomen after Foley catheter was removed showed significant distention of the  patient's  urinary bladder -07/16/17--Case discussed with urology, Dr. Margret Chance foley-->plan to d/c with foley in place until he follows up in urology office -Dr. Jeffie Pollock doubts R-UPJ obstruction -hematuria likely due to UTI and bladder cancer -continue flomax -07/17/17, nephrology ordered to remove the foley catheter despite urology recommendations.   -The patient developed urinary retention once again as the goal now is to focus on the patient's comfort,.  Foley catheter was reinserted.  The patient will discharge to residential hospice with a Foley catheter in place.  Liver and bonemetastasis/Lung nodules -11/20--CT chest and abdomen shows no external lesions, lytic rib lesions, hyper attenuating liver lesions, and dominantLUL lung nodule -Liver biopsy planned for July 18, 2017 but cancelled after family wishes to transition to residential hospice -PSA 0.57 -OutpatientPET scan initially was planned prior to transition to residential hospice -After discussion with the patient's daughter and spouse, initial decision was made to transition the patient's focus of care to focus on full comfort.  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  In addition,  the patient has had progressive anorexia with very poor p.o. Intake.  Given the patient's history of dementia, the patient has also experienced a cognitive decline.  Even during this hospitalization, the patient's functional and cognitive status has gradually declined.  Given his continued decline, he will be a poor candidate for further aggressive medical therapies.  We discussed options including but not limited to full scope of care including liver biopsy and ultimately referral to medical oncologist and follow-up with urologist and discharged to SNF as well as changing the patient's focus of care to focus on his comfort/dignity with ultimate transition to residential hospice.  After extensive discussion, it was mutually agreed that it was in the patient's best interest to transition the patient to residential hospice with focus on his comfort and dignity. Overall prognosis approximately < 1 month.     Discharge Instructions  Discharge Instructions    Diet general   Complete by:  As directed      Allergies as of 07/18/2017      Reactions   Morphine And Related    Levels drop too low      Medication List    STOP taking these medications   amLODipine 10 MG tablet Commonly known as:  NORVASC   aspirin EC 81 MG tablet   atorvastatin 20 MG tablet Commonly known as:  LIPITOR   benazepril 20 MG tablet Commonly known as:  LOTENSIN   carvedilol 12.5 MG tablet Commonly known as:  COREG   docusate sodium 100 MG capsule Commonly known as:  COLACE   donepezil 5 MG tablet Commonly known as:  ARICEPT   EQ COMPLETE MULTIVIT ADULT 50+ Tabs   escitalopram 10 MG tablet Commonly known as:  LEXAPRO   FISH-FLAX-BORAGE Caps     TAKE these medications   LORazepam 2 MG/ML concentrated solution Commonly known as:  ATIVAN Take 0.5  mLs (1 mg total) by mouth every 4 (four) hours as needed for anxiety.   morphine CONCENTRATE 10 mg / 0.5 ml concentrated solution Take 0.2 mLs (4 mg total)  by mouth every 2 (two) hours as needed for severe pain, anxiety or shortness of breath.       Allergies  Allergen Reactions  . Morphine And Related     Levels drop too low    Consultations:  nephrology   Procedures/Studies: Ct Head Wo Contrast  Result Date: 07/11/2017 CLINICAL DATA:  83 year old male with generalized weakness. EXAM: CT HEAD WITHOUT CONTRAST TECHNIQUE: Contiguous axial images were obtained from the base of the skull through the vertex without intravenous contrast. COMPARISON:  None. FINDINGS: Brain: There is moderate age-related atrophy and chronic microvascular ischemic changes. Small left lentiform nucleus old lacunar infarct. There is no acute intracranial hemorrhage. No mass effect midline shift noted. No extra-axial fluid collection. Vascular: There is atherosclerotic calcification of the cavernous ICA and right MCA. Atherosclerotic calcification of the right vertebral artery. Skull: Normal. Negative for fracture or focal lesion. Sinuses/Orbits: No acute finding. Other: Bilateral cataract surgeries. IMPRESSION: 1. No acute intracranial hemorrhage. 2. Moderate age-related atrophy and chronic microvascular ischemic changes. If symptoms persist, and there are no contraindications, MRI may provide better evaluation if clinically indicated. Electronically Signed   By: Anner Crete M.D.   On: 07/11/2017 22:15   Ct Chest W Contrast  Result Date: 07/16/2017 CLINICAL DATA:  Bladder cancer and history of left nephrectomy for renal cell carcinoma. EXAM: CT CHEST, ABDOMEN, AND PELVIS WITH CONTRAST TECHNIQUE: Multidetector CT imaging of the chest, abdomen and pelvis was performed following the standard protocol during bolus administration of intravenous contrast. CONTRAST:  161mL ISOVUE-300 IOPAMIDOL (ISOVUE-300) INJECTION 61% COMPARISON:  Noncontrast abdomen and pelvis CT 07/14/2017 FINDINGS: CT CHEST FINDINGS Cardiovascular: Heart size upper normal. Coronary artery  calcification is evident. Atherosclerotic calcification is noted in the wall of the thoracic aorta. Mediastinum/Nodes: 8 mm short axis subcarinal lymph node is upper normal. 8 mm short axis lymph node identified in the left hilum. There is abnormal soft tissue in left hilum, circumferentially encasing the left upper lobe bronchus. Calcified nodal tissue is seen in the left hilum. There is an area of hyper enhancement in the posterior wall the distal esophagus (see image 48 series 2). Lungs/Pleura: 2.0 x 2.9 cm nodule is identified in the left upper lobe. 3 mm uncalcified right upper lobe nodule is seen on image 61 in the posterior right upper lobe noncalcified 3-4 mm nodule is seen on image 47. There is some trace atelectasis in the lung bases. Small calcified granuloma identified right lung. Musculoskeletal: Multiple lytic bone lesions are identified including sternum (image 53), posterior right fourth rib, lateral right eighth rib, and lateral left fifth rib. CT ABDOMEN PELVIS FINDINGS Hepatobiliary: Multiple ill-defined hypoattenuating liver lesions are identified involving both hepatic lobes. Index lesion posterior right liver measures 11 mm on image 53 series 2. There is no evidence for gallstones, gallbladder wall thickening, or pericholecystic fluid. No intrahepatic or extrahepatic biliary dilation. Pancreas: No focal mass lesion. No dilatation of the main duct. No intraparenchymal cyst. No peripancreatic edema. Spleen: Calcified granulomata noted in the spleen. Small hypoattenuating lesions in the medial spleen and measure up to 12 mm. Adrenals/Urinary Tract: Thickening of the adrenal glands noted without discrete nodule or mass. Left kidney surgically absent. Rim calcified lesion posterior left retroperitoneal space likely postsurgical in may represent chronic hematoma. Mild right hydronephrosis noted with probable 13 mm  cyst interpolar right kidney. The degree of collecting system distention seems out of  proportion to the degree of right ureteral fullness suggesting a component of right UPJ obstruction. Bladder is moderately distended. Stomach/Bowel: Stomach is nondistended. No gastric wall thickening. No evidence of outlet obstruction. Duodenum is normally positioned as is the ligament of Treitz. No small bowel wall thickening. No small bowel dilatation. The terminal ileum is normal. The appendix is normal. Diverticular changes are noted in the left colon without evidence of diverticulitis. Vascular/Lymphatic: Status post aorto bi-iliac bypass grafting. Upper normal lymph nodes are seen in the hepatoduodenal ligament. Small retroperitoneal lymph nodes identified. No pelvic sidewall lymphadenopathy. Reproductive: Central TURP defect identified in the prostate gland. Other: No intraperitoneal free fluid. Musculoskeletal: Multiple lytic bone lesions are identified including 2.9 cm lesion right L5 vertebral body. Several small lytic lesions are seen in the right iliac bone. IMPRESSION: 1. 2.9 cm left upper lobe pulmonary nodule. This is associated with left hilar lymphadenopathy. Imaging features may be related to metastatic disease in this patient with a history of renal and bladder cancer. Primary bronchogenic neoplasm is also consideration. 2. Multiple hypoenhancing lesions scattered through both hepatic lobes consistent with metastatic disease. 3. Multiple lytic bone lesions identified in the ribs, sternum, spine, and bony pelvis, consistent with metastatic disease. 4. Hyperattenuating lesion posterior wall distal esophagus, indeterminate. As this was not visible on the noncontrast CT yesterday, the hyperattenuation is related to contrast and is probably oral contrast pooling in a distal esophageal diverticulum or large ulcer. Enhancement from intravascular contrast of a lesion such is vascular malformation is less likely. 5. Small hypoattenuating lesions in the medial spleen, indeterminate. 6.  Aortic  Atherosclerois (ICD10-170.0) 7. With the patient has had recent imaging elsewhere, comparison to that earlier imaging would likely prove helpful. In the absence of recent imaging, PET-CT may be beneficial to further evaluate. Electronically Signed   By: Misty Stanley M.D.   On: 07/16/2017 07:53   Ct Abdomen Pelvis W Contrast  Result Date: 07/16/2017 CLINICAL DATA:  Bladder cancer and history of left nephrectomy for renal cell carcinoma. EXAM: CT CHEST, ABDOMEN, AND PELVIS WITH CONTRAST TECHNIQUE: Multidetector CT imaging of the chest, abdomen and pelvis was performed following the standard protocol during bolus administration of intravenous contrast. CONTRAST:  172mL ISOVUE-300 IOPAMIDOL (ISOVUE-300) INJECTION 61% COMPARISON:  Noncontrast abdomen and pelvis CT 07/14/2017 FINDINGS: CT CHEST FINDINGS Cardiovascular: Heart size upper normal. Coronary artery calcification is evident. Atherosclerotic calcification is noted in the wall of the thoracic aorta. Mediastinum/Nodes: 8 mm short axis subcarinal lymph node is upper normal. 8 mm short axis lymph node identified in the left hilum. There is abnormal soft tissue in left hilum, circumferentially encasing the left upper lobe bronchus. Calcified nodal tissue is seen in the left hilum. There is an area of hyper enhancement in the posterior wall the distal esophagus (see image 48 series 2). Lungs/Pleura: 2.0 x 2.9 cm nodule is identified in the left upper lobe. 3 mm uncalcified right upper lobe nodule is seen on image 61 in the posterior right upper lobe noncalcified 3-4 mm nodule is seen on image 47. There is some trace atelectasis in the lung bases. Small calcified granuloma identified right lung. Musculoskeletal: Multiple lytic bone lesions are identified including sternum (image 53), posterior right fourth rib, lateral right eighth rib, and lateral left fifth rib. CT ABDOMEN PELVIS FINDINGS Hepatobiliary: Multiple ill-defined hypoattenuating liver lesions are  identified involving both hepatic lobes. Index lesion posterior right liver measures 11  mm on image 53 series 2. There is no evidence for gallstones, gallbladder wall thickening, or pericholecystic fluid. No intrahepatic or extrahepatic biliary dilation. Pancreas: No focal mass lesion. No dilatation of the main duct. No intraparenchymal cyst. No peripancreatic edema. Spleen: Calcified granulomata noted in the spleen. Small hypoattenuating lesions in the medial spleen and measure up to 12 mm. Adrenals/Urinary Tract: Thickening of the adrenal glands noted without discrete nodule or mass. Left kidney surgically absent. Rim calcified lesion posterior left retroperitoneal space likely postsurgical in may represent chronic hematoma. Mild right hydronephrosis noted with probable 13 mm cyst interpolar right kidney. The degree of collecting system distention seems out of proportion to the degree of right ureteral fullness suggesting a component of right UPJ obstruction. Bladder is moderately distended. Stomach/Bowel: Stomach is nondistended. No gastric wall thickening. No evidence of outlet obstruction. Duodenum is normally positioned as is the ligament of Treitz. No small bowel wall thickening. No small bowel dilatation. The terminal ileum is normal. The appendix is normal. Diverticular changes are noted in the left colon without evidence of diverticulitis. Vascular/Lymphatic: Status post aorto bi-iliac bypass grafting. Upper normal lymph nodes are seen in the hepatoduodenal ligament. Small retroperitoneal lymph nodes identified. No pelvic sidewall lymphadenopathy. Reproductive: Central TURP defect identified in the prostate gland. Other: No intraperitoneal free fluid. Musculoskeletal: Multiple lytic bone lesions are identified including 2.9 cm lesion right L5 vertebral body. Several small lytic lesions are seen in the right iliac bone. IMPRESSION: 1. 2.9 cm left upper lobe pulmonary nodule. This is associated with left  hilar lymphadenopathy. Imaging features may be related to metastatic disease in this patient with a history of renal and bladder cancer. Primary bronchogenic neoplasm is also consideration. 2. Multiple hypoenhancing lesions scattered through both hepatic lobes consistent with metastatic disease. 3. Multiple lytic bone lesions identified in the ribs, sternum, spine, and bony pelvis, consistent with metastatic disease. 4. Hyperattenuating lesion posterior wall distal esophagus, indeterminate. As this was not visible on the noncontrast CT yesterday, the hyperattenuation is related to contrast and is probably oral contrast pooling in a distal esophageal diverticulum or large ulcer. Enhancement from intravascular contrast of a lesion such is vascular malformation is less likely. 5. Small hypoattenuating lesions in the medial spleen, indeterminate. 6.  Aortic Atherosclerois (ICD10-170.0) 7. With the patient has had recent imaging elsewhere, comparison to that earlier imaging would likely prove helpful. In the absence of recent imaging, PET-CT may be beneficial to further evaluate. Electronically Signed   By: Misty Stanley M.D.   On: 07/16/2017 07:53   US Renal  Result Date: 07/12/2017 CLINICAL DATA:  Sepsis, UTI.  Prior left nephrectomy EXAM: RENAL / URINARY TRACT ULTRASOUND COMPLETE COMPARISON:  None. FINDINGS: Right Kidney: Length: 13.0 cm. Moderate hydronephrosis. No visible mass. Normal echotexture. Left Kidney: Length: Prior left nephrectomy. Bladder: Appears normal for degree of bladder distention. IMPRESSION: Moderate right hydronephrosis. Prior left nephrectomy. Electronically Signed   By: Rolm Baptise M.D.   On: 07/12/2017 12:44   Dg Chest Port 1 View  Result Date: 07/11/2017 CLINICAL DATA:  Weakness for 1 month EXAM: PORTABLE CHEST 1 VIEW COMPARISON:  None. FINDINGS: Cardiac shadow is within normal limits. Aortic calcifications are noted. The lungs are well aerated bilaterally. No focal infiltrate or  sizable effusion is seen. Prominent calcification is noted the costal sternal margins of the first ribs bilaterally. IMPRESSION: No acute abnormality noted. Electronically Signed   By: Inez Catalina M.D.   On: 07/11/2017 21:35   Ct Renal Stone Study  Result Date: 07/14/2017 CLINICAL DATA:  Hydronephrosis. Sepsis. Urinary tract infection. History of renal cell carcinoma and left nephrectomy. EXAM: CT ABDOMEN AND PELVIS WITHOUT CONTRAST TECHNIQUE: Multidetector CT imaging of the abdomen and pelvis was performed following the standard protocol without IV contrast. COMPARISON:  Renal ultrasound 07/12/2017 FINDINGS: Lower chest: Minimal atelectasis in the lung bases. No pleural effusion. Extensive coronary artery atherosclerosis. Hepatobiliary: Numerous small hypodense lesions in the liver measuring up to 1.5 cm, incompletely characterized without IV contrast though with the larger lesions not clearly having attenuation of cysts. Unremarkable gallbladder. No significant biliary dilatation. Pancreas: Small calcifications scattered throughout the pancreas which may reflect the sequelae of chronic pancreatitis. No evidence of significant acute peripancreatic inflammation or ductal dilatation. Spleen: Punctate calcifications in the spleen as well as a poorly defined 8 mm low-density lesion medially. Adrenals/Urinary Tract: 1.7 cm left adrenal nodule (attenuation of 20 HU). Unremarkable right adrenal gland. Prior left nephrectomy. Peripherally calcified retroperitoneal collection extending from the left renal fossa inferiorly over a length of 10 cm, likely old hematoma. 1.5 cm hypodense lesion in the medial interpolar right kidney, likely a cyst. Mild right hydronephrosis. At most minimal dilatation or wall thickening of the proximal right ureter, decompressed distally. Foley catheter in place with decompressed bladder. No urinary tract calculi identified. Stomach/Bowel: Small sliding hiatal hernia. No evidence of bowel  obstruction. Prominent sigmoid colon diverticulosis with mild surrounding fat stranding. Unremarkable appendix. Vascular/Lymphatic: Atherosclerosis of the abdominal aorta and its major branch vessels. Prior aortic aneurysm repair, with the infrarenal abdominal aorta measuring up to 3.8 cm in diameter. No enlarged lymph nodes. Reproductive: Unremarkable prostate. Symmetric mild nodularity of the seminal vesicles. Other: No intraperitoneal free fluid. Postsurgical changes in the midline anterior abdominal wall. Musculoskeletal: Multiple small lucent osseous lesions in the pelvis, including a 1.9 cm lesion in the posterior left ilium. 2.7 cm lytic lesion with cortical disruption in the right aspect of the L5 vertebral body. Scattered small sclerotic spine lesions as well. Mild T11 compression fracture with inferior endplate Schmorl's node deformity, likely chronic with a bridging osteophyte noted across the anterior disc space. IMPRESSION: 1. Mild right hydronephrosis without obstructing stone identified. Upper tract infection is possible. 2. Sigmoid colon diverticulosis with mild surrounding stranding, query mild acute diverticulitis. 3. Prior left nephrectomy. Calcified collection in the left retroperitoneum likely reflects an old hematoma. 4. Multiple osseous lesions, including a 2.7 cm lytic lesion in the L5 vertebral body concerning for metastases. 5. Indeterminate lesions in the liver, spleen, and left adrenal gland. Suggest correlation with prior outside imaging or nonemergent evaluation with contrast-enhanced abdominal MRI (preferably as an outpatient when the patient is able to follow directions and breath hold). 6. Aortic Atherosclerosis (ICD10-I70.0). Prior abdominal aortic aneurysm repair. Electronically Signed   By: Logan Bores M.D.   On: 07/14/2017 12:42        Discharge Exam: Vitals:   07/17/17 2107 07/18/17 0635  BP: 129/64 (!) 122/59  Pulse: 64 68  Resp: 18 20  Temp: 98.5 F (36.9 C)  98.4 F (36.9 C)  SpO2: 95% 95%   Vitals:   07/17/17 0610 07/17/17 1320 07/17/17 2107 07/18/17 0635  BP: 138/71 128/66 129/64 (!) 122/59  Pulse: 63 65 64 68  Resp:  18 18 20   Temp:  98.3 F (36.8 C) 98.5 F (36.9 C) 98.4 F (36.9 C)  TempSrc:  Oral Oral Oral  SpO2:  95% 95% 95%  Weight:      Height:  General: Pt is alert, awake, not in acute distress Cardiovascular: RRR, S1/S2 +, no rubs, no gallops Respiratory: CTA bilaterally, no wheezing, no rhonchi Abdominal: Soft, NT, ND, bowel sounds + Extremities: no edema, no cyanosis   The results of significant diagnostics from this hospitalization (including imaging, microbiology, ancillary and laboratory) are listed below for reference.    Significant Diagnostic Studies: Ct Head Wo Contrast  Result Date: 07/11/2017 CLINICAL DATA:  83 year old male with generalized weakness. EXAM: CT HEAD WITHOUT CONTRAST TECHNIQUE: Contiguous axial images were obtained from the base of the skull through the vertex without intravenous contrast. COMPARISON:  None. FINDINGS: Brain: There is moderate age-related atrophy and chronic microvascular ischemic changes. Small left lentiform nucleus old lacunar infarct. There is no acute intracranial hemorrhage. No mass effect midline shift noted. No extra-axial fluid collection. Vascular: There is atherosclerotic calcification of the cavernous ICA and right MCA. Atherosclerotic calcification of the right vertebral artery. Skull: Normal. Negative for fracture or focal lesion. Sinuses/Orbits: No acute finding. Other: Bilateral cataract surgeries. IMPRESSION: 1. No acute intracranial hemorrhage. 2. Moderate age-related atrophy and chronic microvascular ischemic changes. If symptoms persist, and there are no contraindications, MRI may provide better evaluation if clinically indicated. Electronically Signed   By: Anner Crete M.D.   On: 07/11/2017 22:15   Ct Chest W Contrast  Result Date:  07/16/2017 CLINICAL DATA:  Bladder cancer and history of left nephrectomy for renal cell carcinoma. EXAM: CT CHEST, ABDOMEN, AND PELVIS WITH CONTRAST TECHNIQUE: Multidetector CT imaging of the chest, abdomen and pelvis was performed following the standard protocol during bolus administration of intravenous contrast. CONTRAST:  178mL ISOVUE-300 IOPAMIDOL (ISOVUE-300) INJECTION 61% COMPARISON:  Noncontrast abdomen and pelvis CT 07/14/2017 FINDINGS: CT CHEST FINDINGS Cardiovascular: Heart size upper normal. Coronary artery calcification is evident. Atherosclerotic calcification is noted in the wall of the thoracic aorta. Mediastinum/Nodes: 8 mm short axis subcarinal lymph node is upper normal. 8 mm short axis lymph node identified in the left hilum. There is abnormal soft tissue in left hilum, circumferentially encasing the left upper lobe bronchus. Calcified nodal tissue is seen in the left hilum. There is an area of hyper enhancement in the posterior wall the distal esophagus (see image 48 series 2). Lungs/Pleura: 2.0 x 2.9 cm nodule is identified in the left upper lobe. 3 mm uncalcified right upper lobe nodule is seen on image 61 in the posterior right upper lobe noncalcified 3-4 mm nodule is seen on image 47. There is some trace atelectasis in the lung bases. Small calcified granuloma identified right lung. Musculoskeletal: Multiple lytic bone lesions are identified including sternum (image 53), posterior right fourth rib, lateral right eighth rib, and lateral left fifth rib. CT ABDOMEN PELVIS FINDINGS Hepatobiliary: Multiple ill-defined hypoattenuating liver lesions are identified involving both hepatic lobes. Index lesion posterior right liver measures 11 mm on image 53 series 2. There is no evidence for gallstones, gallbladder wall thickening, or pericholecystic fluid. No intrahepatic or extrahepatic biliary dilation. Pancreas: No focal mass lesion. No dilatation of the main duct. No intraparenchymal cyst. No  peripancreatic edema. Spleen: Calcified granulomata noted in the spleen. Small hypoattenuating lesions in the medial spleen and measure up to 12 mm. Adrenals/Urinary Tract: Thickening of the adrenal glands noted without discrete nodule or mass. Left kidney surgically absent. Rim calcified lesion posterior left retroperitoneal space likely postsurgical in may represent chronic hematoma. Mild right hydronephrosis noted with probable 13 mm cyst interpolar right kidney. The degree of collecting system distention seems out of proportion to the  degree of right ureteral fullness suggesting a component of right UPJ obstruction. Bladder is moderately distended. Stomach/Bowel: Stomach is nondistended. No gastric wall thickening. No evidence of outlet obstruction. Duodenum is normally positioned as is the ligament of Treitz. No small bowel wall thickening. No small bowel dilatation. The terminal ileum is normal. The appendix is normal. Diverticular changes are noted in the left colon without evidence of diverticulitis. Vascular/Lymphatic: Status post aorto bi-iliac bypass grafting. Upper normal lymph nodes are seen in the hepatoduodenal ligament. Small retroperitoneal lymph nodes identified. No pelvic sidewall lymphadenopathy. Reproductive: Central TURP defect identified in the prostate gland. Other: No intraperitoneal free fluid. Musculoskeletal: Multiple lytic bone lesions are identified including 2.9 cm lesion right L5 vertebral body. Several small lytic lesions are seen in the right iliac bone. IMPRESSION: 1. 2.9 cm left upper lobe pulmonary nodule. This is associated with left hilar lymphadenopathy. Imaging features may be related to metastatic disease in this patient with a history of renal and bladder cancer. Primary bronchogenic neoplasm is also consideration. 2. Multiple hypoenhancing lesions scattered through both hepatic lobes consistent with metastatic disease. 3. Multiple lytic bone lesions identified in the ribs,  sternum, spine, and bony pelvis, consistent with metastatic disease. 4. Hyperattenuating lesion posterior wall distal esophagus, indeterminate. As this was not visible on the noncontrast CT yesterday, the hyperattenuation is related to contrast and is probably oral contrast pooling in a distal esophageal diverticulum or large ulcer. Enhancement from intravascular contrast of a lesion such is vascular malformation is less likely. 5. Small hypoattenuating lesions in the medial spleen, indeterminate. 6.  Aortic Atherosclerois (ICD10-170.0) 7. With the patient has had recent imaging elsewhere, comparison to that earlier imaging would likely prove helpful. In the absence of recent imaging, PET-CT may be beneficial to further evaluate. Electronically Signed   By: Misty Stanley M.D.   On: 07/16/2017 07:53   Ct Abdomen Pelvis W Contrast  Result Date: 07/16/2017 CLINICAL DATA:  Bladder cancer and history of left nephrectomy for renal cell carcinoma. EXAM: CT CHEST, ABDOMEN, AND PELVIS WITH CONTRAST TECHNIQUE: Multidetector CT imaging of the chest, abdomen and pelvis was performed following the standard protocol during bolus administration of intravenous contrast. CONTRAST:  132mL ISOVUE-300 IOPAMIDOL (ISOVUE-300) INJECTION 61% COMPARISON:  Noncontrast abdomen and pelvis CT 07/14/2017 FINDINGS: CT CHEST FINDINGS Cardiovascular: Heart size upper normal. Coronary artery calcification is evident. Atherosclerotic calcification is noted in the wall of the thoracic aorta. Mediastinum/Nodes: 8 mm short axis subcarinal lymph node is upper normal. 8 mm short axis lymph node identified in the left hilum. There is abnormal soft tissue in left hilum, circumferentially encasing the left upper lobe bronchus. Calcified nodal tissue is seen in the left hilum. There is an area of hyper enhancement in the posterior wall the distal esophagus (see image 48 series 2). Lungs/Pleura: 2.0 x 2.9 cm nodule is identified in the left upper lobe. 3  mm uncalcified right upper lobe nodule is seen on image 61 in the posterior right upper lobe noncalcified 3-4 mm nodule is seen on image 47. There is some trace atelectasis in the lung bases. Small calcified granuloma identified right lung. Musculoskeletal: Multiple lytic bone lesions are identified including sternum (image 53), posterior right fourth rib, lateral right eighth rib, and lateral left fifth rib. CT ABDOMEN PELVIS FINDINGS Hepatobiliary: Multiple ill-defined hypoattenuating liver lesions are identified involving both hepatic lobes. Index lesion posterior right liver measures 11 mm on image 53 series 2. There is no evidence for gallstones, gallbladder wall thickening, or pericholecystic  fluid. No intrahepatic or extrahepatic biliary dilation. Pancreas: No focal mass lesion. No dilatation of the main duct. No intraparenchymal cyst. No peripancreatic edema. Spleen: Calcified granulomata noted in the spleen. Small hypoattenuating lesions in the medial spleen and measure up to 12 mm. Adrenals/Urinary Tract: Thickening of the adrenal glands noted without discrete nodule or mass. Left kidney surgically absent. Rim calcified lesion posterior left retroperitoneal space likely postsurgical in may represent chronic hematoma. Mild right hydronephrosis noted with probable 13 mm cyst interpolar right kidney. The degree of collecting system distention seems out of proportion to the degree of right ureteral fullness suggesting a component of right UPJ obstruction. Bladder is moderately distended. Stomach/Bowel: Stomach is nondistended. No gastric wall thickening. No evidence of outlet obstruction. Duodenum is normally positioned as is the ligament of Treitz. No small bowel wall thickening. No small bowel dilatation. The terminal ileum is normal. The appendix is normal. Diverticular changes are noted in the left colon without evidence of diverticulitis. Vascular/Lymphatic: Status post aorto bi-iliac bypass grafting.  Upper normal lymph nodes are seen in the hepatoduodenal ligament. Small retroperitoneal lymph nodes identified. No pelvic sidewall lymphadenopathy. Reproductive: Central TURP defect identified in the prostate gland. Other: No intraperitoneal free fluid. Musculoskeletal: Multiple lytic bone lesions are identified including 2.9 cm lesion right L5 vertebral body. Several small lytic lesions are seen in the right iliac bone. IMPRESSION: 1. 2.9 cm left upper lobe pulmonary nodule. This is associated with left hilar lymphadenopathy. Imaging features may be related to metastatic disease in this patient with a history of renal and bladder cancer. Primary bronchogenic neoplasm is also consideration. 2. Multiple hypoenhancing lesions scattered through both hepatic lobes consistent with metastatic disease. 3. Multiple lytic bone lesions identified in the ribs, sternum, spine, and bony pelvis, consistent with metastatic disease. 4. Hyperattenuating lesion posterior wall distal esophagus, indeterminate. As this was not visible on the noncontrast CT yesterday, the hyperattenuation is related to contrast and is probably oral contrast pooling in a distal esophageal diverticulum or large ulcer. Enhancement from intravascular contrast of a lesion such is vascular malformation is less likely. 5. Small hypoattenuating lesions in the medial spleen, indeterminate. 6.  Aortic Atherosclerois (ICD10-170.0) 7. With the patient has had recent imaging elsewhere, comparison to that earlier imaging would likely prove helpful. In the absence of recent imaging, PET-CT may be beneficial to further evaluate. Electronically Signed   By: Misty Stanley M.D.   On: 07/16/2017 07:53   US Renal  Result Date: 07/12/2017 CLINICAL DATA:  Sepsis, UTI.  Prior left nephrectomy EXAM: RENAL / URINARY TRACT ULTRASOUND COMPLETE COMPARISON:  None. FINDINGS: Right Kidney: Length: 13.0 cm. Moderate hydronephrosis. No visible mass. Normal echotexture. Left Kidney:  Length: Prior left nephrectomy. Bladder: Appears normal for degree of bladder distention. IMPRESSION: Moderate right hydronephrosis. Prior left nephrectomy. Electronically Signed   By: Rolm Baptise M.D.   On: 07/12/2017 12:44   Dg Chest Port 1 View  Result Date: 07/11/2017 CLINICAL DATA:  Weakness for 1 month EXAM: PORTABLE CHEST 1 VIEW COMPARISON:  None. FINDINGS: Cardiac shadow is within normal limits. Aortic calcifications are noted. The lungs are well aerated bilaterally. No focal infiltrate or sizable effusion is seen. Prominent calcification is noted the costal sternal margins of the first ribs bilaterally. IMPRESSION: No acute abnormality noted. Electronically Signed   By: Inez Catalina M.D.   On: 07/11/2017 21:35   Ct Renal Stone Study  Result Date: 07/14/2017 CLINICAL DATA:  Hydronephrosis. Sepsis. Urinary tract infection. History of renal cell carcinoma  and left nephrectomy. EXAM: CT ABDOMEN AND PELVIS WITHOUT CONTRAST TECHNIQUE: Multidetector CT imaging of the abdomen and pelvis was performed following the standard protocol without IV contrast. COMPARISON:  Renal ultrasound 07/12/2017 FINDINGS: Lower chest: Minimal atelectasis in the lung bases. No pleural effusion. Extensive coronary artery atherosclerosis. Hepatobiliary: Numerous small hypodense lesions in the liver measuring up to 1.5 cm, incompletely characterized without IV contrast though with the larger lesions not clearly having attenuation of cysts. Unremarkable gallbladder. No significant biliary dilatation. Pancreas: Small calcifications scattered throughout the pancreas which may reflect the sequelae of chronic pancreatitis. No evidence of significant acute peripancreatic inflammation or ductal dilatation. Spleen: Punctate calcifications in the spleen as well as a poorly defined 8 mm low-density lesion medially. Adrenals/Urinary Tract: 1.7 cm left adrenal nodule (attenuation of 20 HU). Unremarkable right adrenal gland. Prior left  nephrectomy. Peripherally calcified retroperitoneal collection extending from the left renal fossa inferiorly over a length of 10 cm, likely old hematoma. 1.5 cm hypodense lesion in the medial interpolar right kidney, likely a cyst. Mild right hydronephrosis. At most minimal dilatation or wall thickening of the proximal right ureter, decompressed distally. Foley catheter in place with decompressed bladder. No urinary tract calculi identified. Stomach/Bowel: Small sliding hiatal hernia. No evidence of bowel obstruction. Prominent sigmoid colon diverticulosis with mild surrounding fat stranding. Unremarkable appendix. Vascular/Lymphatic: Atherosclerosis of the abdominal aorta and its major branch vessels. Prior aortic aneurysm repair, with the infrarenal abdominal aorta measuring up to 3.8 cm in diameter. No enlarged lymph nodes. Reproductive: Unremarkable prostate. Symmetric mild nodularity of the seminal vesicles. Other: No intraperitoneal free fluid. Postsurgical changes in the midline anterior abdominal wall. Musculoskeletal: Multiple small lucent osseous lesions in the pelvis, including a 1.9 cm lesion in the posterior left ilium. 2.7 cm lytic lesion with cortical disruption in the right aspect of the L5 vertebral body. Scattered small sclerotic spine lesions as well. Mild T11 compression fracture with inferior endplate Schmorl's node deformity, likely chronic with a bridging osteophyte noted across the anterior disc space. IMPRESSION: 1. Mild right hydronephrosis without obstructing stone identified. Upper tract infection is possible. 2. Sigmoid colon diverticulosis with mild surrounding stranding, query mild acute diverticulitis. 3. Prior left nephrectomy. Calcified collection in the left retroperitoneum likely reflects an old hematoma. 4. Multiple osseous lesions, including a 2.7 cm lytic lesion in the L5 vertebral body concerning for metastases. 5. Indeterminate lesions in the liver, spleen, and left adrenal  gland. Suggest correlation with prior outside imaging or nonemergent evaluation with contrast-enhanced abdominal MRI (preferably as an outpatient when the patient is able to follow directions and breath hold). 6. Aortic Atherosclerosis (ICD10-I70.0). Prior abdominal aortic aneurysm repair. Electronically Signed   By: Logan Bores M.D.   On: 07/14/2017 12:42     Microbiology: Recent Results (from the past 240 hour(s))  Urine Culture     Status: None   Collection Time: 07/12/17  8:30 AM  Result Value Ref Range Status   Specimen Description URINE, CLEAN CATCH  Final   Special Requests NONE  Final   Culture   Final    NO GROWTH Performed at [facility], [address], [place], [place] [address]    Report Status 07/14/2017 FINAL  Final     Labs: Basic Metabolic Panel: Recent Labs  Lab 07/12/17 0654 07/13/17 0647 07/14/17 0609 07/15/17 0433 07/16/17 0404 07/17/17 0451  NA 130* 131* 133* 133* 131* 132*  K 3.7 3.6 3.2* 4.0 3.9 3.9  CL 95* 98* 98* 99* 97* 96*  CO2 22 21* 23 24 24 25   GLUCOSE 117* 130* 130* 133* 134* 149*  BUN 28* 44* 28* 24* 22* 23*  CREATININE 1.17 2.55* 1.24 1.05 0.84 0.88  CALCIUM 9.6 8.8* 8.7* 8.9 9.0 9.1  MG 2.5*  --   --   --   --   --   PHOS  --   --  3.0 2.5 3.0 3.2   Liver Function Tests: Recent Labs  Lab 07/11/17 2127 07/14/17 0609 07/15/17 0433 07/16/17 0404 07/17/17 0451  AST 46*  --   --   --   --   ALT 57  --   --   --   --   ALKPHOS 150*  --   --   --   --   BILITOT 1.6*  --   --   --   --   PROT 7.6  --   --   --   --   ALBUMIN 4.5 3.6 3.6 3.5 3.5   No results for input(s): LIPASE, AMYLASE in the last 168 hours. No results for input(s): AMMONIA in the last 168 hours. CBC: Recent Labs  Lab 07/11/17 2127  07/13/17 0647 07/14/17 0609 07/15/17 0433 07/16/17 0404 07/17/17 0451  WBC 14.1*   < > 12.4* 10.7* 10.2 10.8* 14.5*  NEUTROABS 11.7*  --   --  8.3* 7.4 8.4* 12.2*  HGB 13.2   < > 12.2* 11.9* 12.2* 12.2* 12.4*  HCT  38.8*   < > 35.6* 34.0* 35.7* 36.3* 36.7*  MCV 96.8   < > 95.7 95.2 97.3 98.1 97.6  PLT 177   < > 156 160 159 148* 134*   < > = values in this interval not displayed.   Cardiac Enzymes: Recent Labs  Lab 07/11/17 2127  TROPONINI <0.03   BNP: Invalid input(s): POCBNP CBG: Recent Labs  Lab 07/12/17 0816 07/12/17 1143 07/12/17 1631 07/15/17 2053 07/16/17 0744  GLUCAP 113* 184* 109* 149* 125*    Time coordinating discharge:  Greater than 30 minutes  Signed:  Britten Parady, DO [facility] Pager: [phone] 07/18/2017, 12:30 PM

## 2017-07-18 NOTE — Care Management Important Message (Signed)
Important Message  Patient Details  Name: Ricky Blankenship MRN: [id number] Date of Birth: April 16, 1932   Medicare Important Message Given:  Yes    Sherald Barge, RN 07/18/2017, 1:15 PM

## 2017-07-18 NOTE — Clinical Social Work Note (Signed)
Patient Information   Patient Name Ricky Blankenship, Ricky Blankenship ([id number]) Sex Male DOB 02/23/1932 SSN [id number]   Room Bed  A327 A327-01  Patient Demographics   Address [place] [address] Phone [phone] [facility]) [phone] (Mobile)  Patient Ethnicity & Race   Ethnic Group Patient Race  Not Hispanic or Latino White or Caucasian  Emergency Contact(s)   Name Relation Home Work Mobile  [name], [name] Daughter   [phone]  [name], [name] Spouse [phone]    Documents on File    Status Date Received Description  Documents for the Patient  [place] Not Received    [facility] E-Signature HIPAA Notice of Privacy Signed 11/94/17   Driver's License Received 40/81/44 [place] 02/2022  Insurance Card Received 07/11/17 New Medicare Card & AARP 2018  Advance Directives/Living Will/HCPOA/POA Not Received    Other Photo ID Not Received    HIM ROI Authorization  07/16/17 [facility]  Patient Photo   Photo of Patient  HIM Release of Information Output  07/16/17 Lab Reports  Documents for the Encounter  Cardiac Monitoring Strip Shift Summary Received 07/11/17   AOB (Assignment of Insurance Benefits) Not Received    E-signature AOB Signed 07/11/17   MEDICARE RIGHTS Not Received    E-signature Medicare Rights Signed 07/11/17   ED Patient Billing Extract   ED PB Summary  Cardiac Monitoring Strip Received 07/12/17   Ultrasound Received 07/12/17   EKG Received 07/14/17   Admission Information   Attending Provider Admitting Provider Admission Type Admission Date/Time  Tat, Shanon Brow, MD Norval Morton, MD Emergency 07/11/17 2040  Discharge Date Hospital Service Auth/Cert Status Service Area   Internal Medicine Incomplete [place]  Unit Room/Bed Admission Status   AP-DEPT 300 A327/A327-01 Admission (Confirmed)   Admission   Complaint  generalized body aches  Hospital Account   Name  Acct ID Class Status Primary Coverage  [name], [name] [id number] Inpatient Open MEDICARE - MEDICARE PART A AND B      Guarantor Account (for Hospital Account [id number])   Name Relation to Pt Service Area Active? Acct Type  [name] Self [facility] Yes Personal/Family  Address Phone    [address] [place] [address] [phone])        Coverage Information (for Hospital Account [id number])   1. [place] PART A AND B   F/O Payor/Plan Precert #  MEDICARE/MEDICARE PART A AND B   Subscriber Subscriber #  [name], [name] [id number]  Address Phone  [address] [place], [place] [address]   2. AARP/AARP   F/O Payor/Plan Precert #  AARP/AARP   Subscriber Subscriber #  [name], [name] [id number]  Address Phone  [phone] [place] [address] [phone]

## 2017-07-18 NOTE — Clinical Social Work Note (Signed)
Patient's family has made the decision to choose [facility].  LCSW sent family Hospice Referral.    Ihor Gully, LCSW

## 2017-07-18 NOTE — Progress Notes (Signed)
Patient IV removed, telemetry removed, tolerated well. Report called to [facility].

## 2017-07-18 NOTE — Clinical Social Work Note (Signed)
LCSW sent discharge clinicals to the facility and arranged transport via [facility]. LCSW signing off.    Meredith Kilbride, Clydene Pugh, LCSW

## 2017-08-26 DEATH — deceased

## 2018-09-18 IMAGING — CT CT CHEST W/ CM
2 of 4 series · 12 of 36 positions shown, 15 images · IV contrast (Isovue)
Comparison: Noncontrast abdomen and pelvis CT 07/14/2017

CLINICAL DATA: Bladder cancer and history of left nephrectomy for
renal cell carcinoma.

EXAM:
CT CHEST, ABDOMEN, AND PELVIS WITH CONTRAST
TECHNIQUE: Multidetector CT imaging of the chest, abdomen and pelvis was
performed following the standard protocol during bolus
administration of intravenous contrast.
CONTRAST:  100mL [id number] IOPAMIDOL (S75T6P-PHH) INJECTION 61%

[Series 2: cap with · axial · 0.83mm/px · z∈[+654,+1229]mm · 9 of 131 slices shown, 12 images]
[im 8/131  mediastinal]
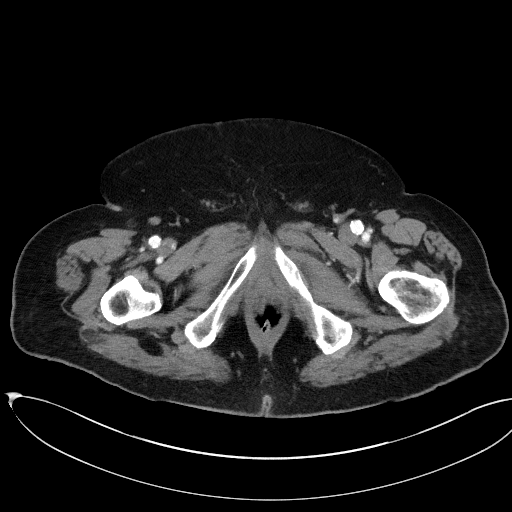
[im 8/131  lung]
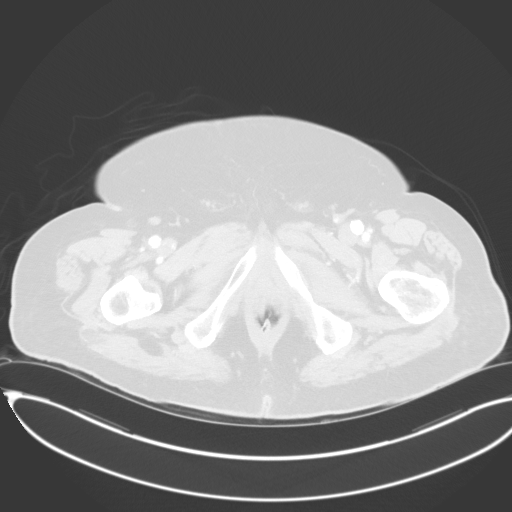
[im 22/131  lung]
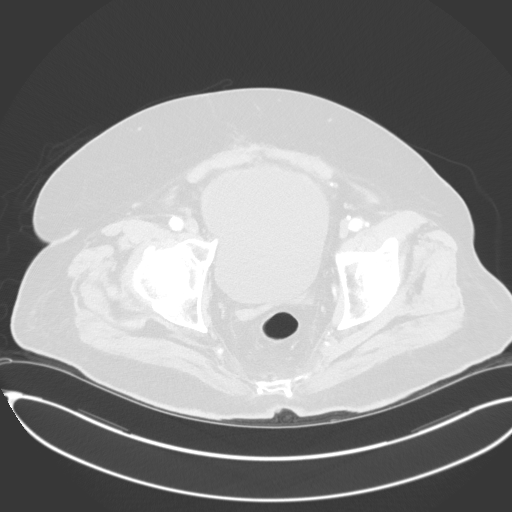
[im 37/131  lung]
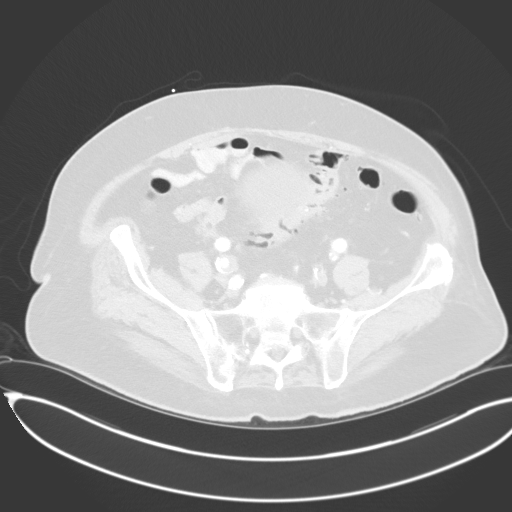
[im 51/131  lung]
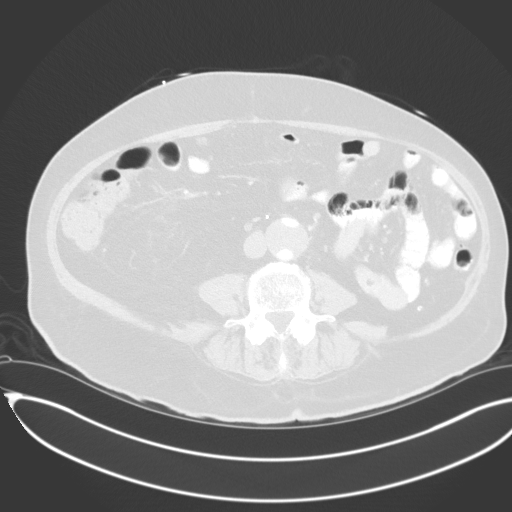
[im 66/131  mediastinal]
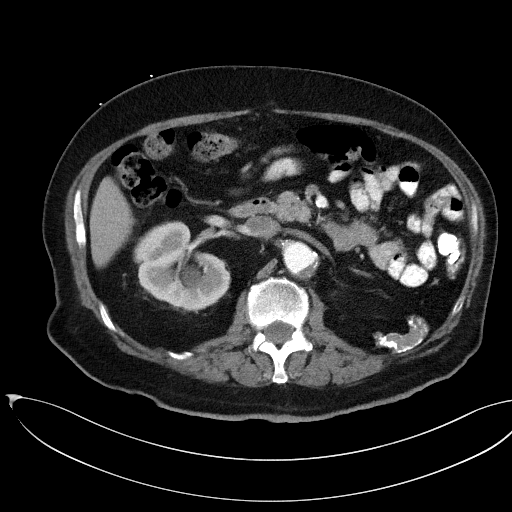
[im 66/131  lung]
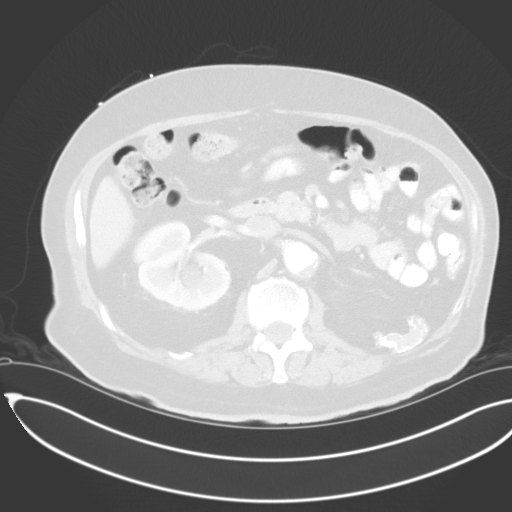
[im 80/131  lung]
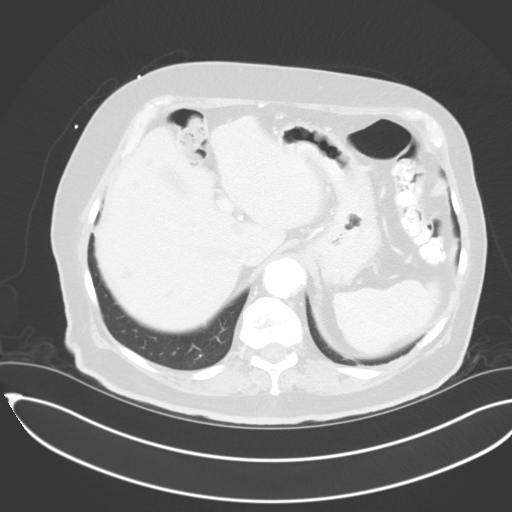
[im 94/131  lung]
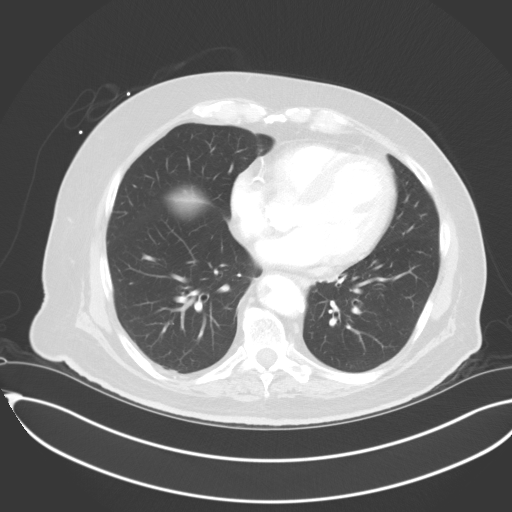
[im 109/131  lung]
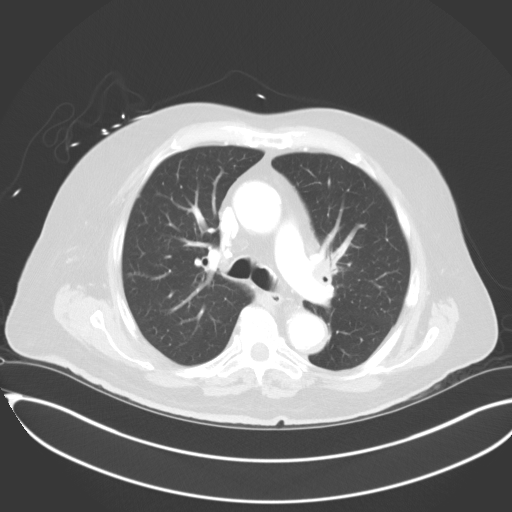
[im 123/131  mediastinal]
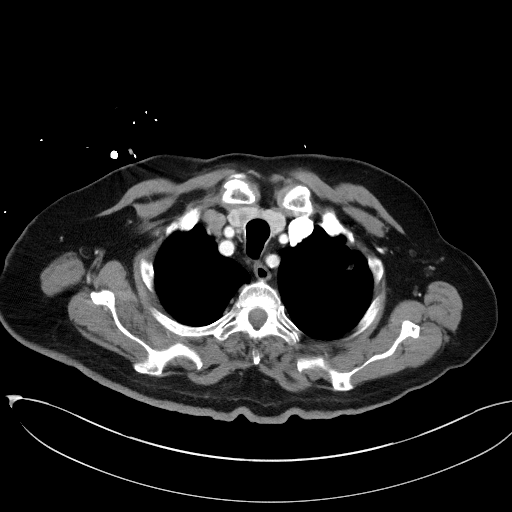
[im 123/131  lung]
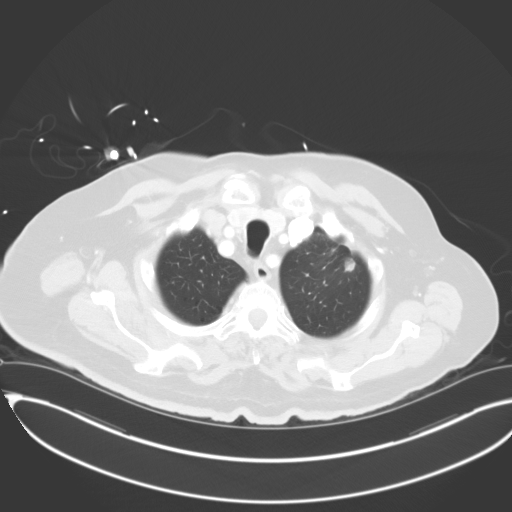

[Series 3: coronals · coronal · 0.82mm/px · 3 of 150 slices shown]
[im 30/150  lung]
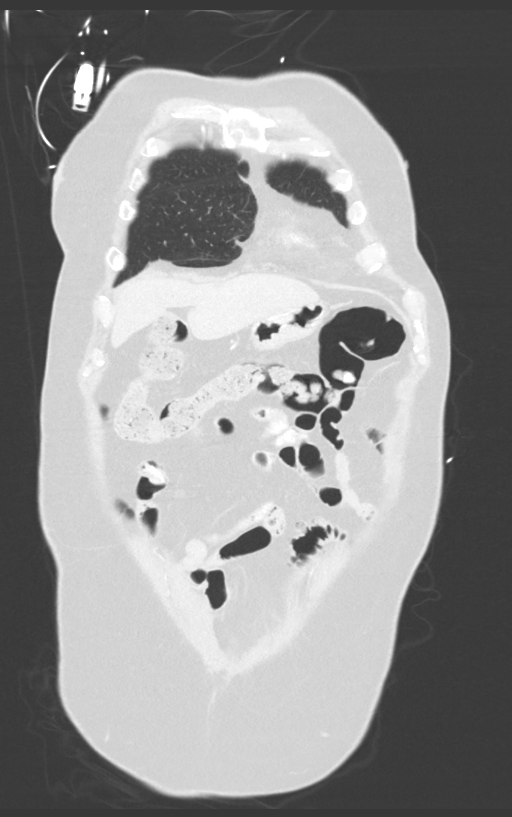
[im 60/150  lung]
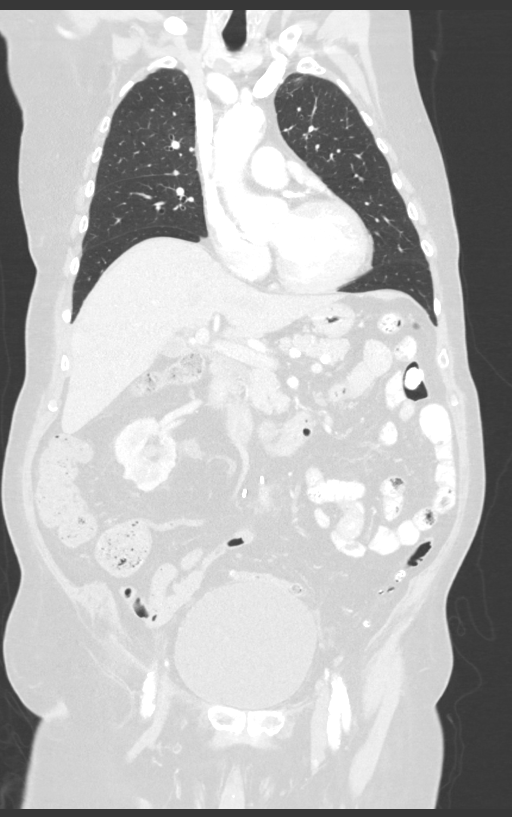
[im 90/150  lung]
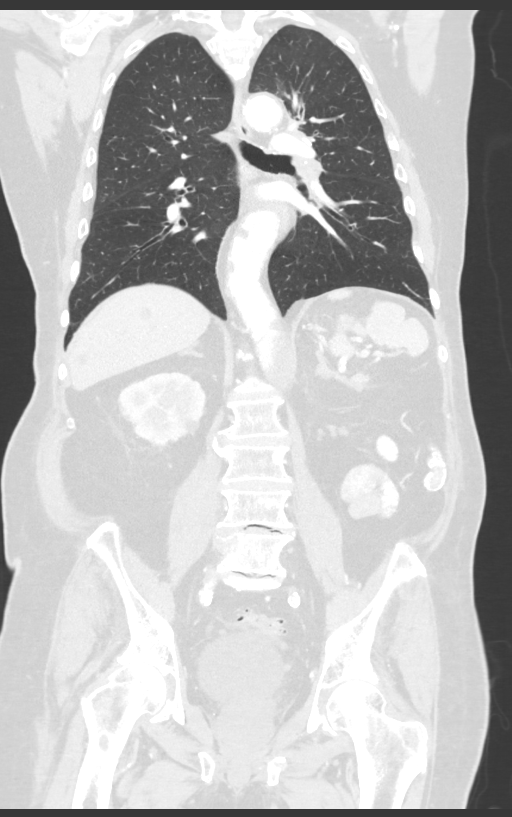

[12 of 36 positions shown; findings below may reference images not displayed]

FINDINGS: CT CHEST FINDINGS

Cardiovascular: Heart size upper normal. Coronary artery
calcification is evident. Atherosclerotic calcification is noted in
the wall of the thoracic aorta.

Mediastinum/Nodes: 8 mm short axis subcarinal lymph node is upper
normal. 8 mm short axis lymph node identified in the left hilum.
There is abnormal soft tissue in left hilum, circumferentially
encasing the left upper lobe bronchus. Calcified nodal tissue is
seen in the left hilum. There is an area of hyper enhancement in the
posterior wall the distal esophagus (see image 48 series 2).

Lungs/Pleura: 2.0 x 2.9 cm nodule is identified in the left upper
lobe. 3 mm uncalcified right upper lobe nodule is seen on image 61
in the posterior right upper lobe noncalcified 3-4 mm nodule is seen
on image 47. There is some trace atelectasis in the lung bases.
Small calcified granuloma identified right lung.

Musculoskeletal: Multiple lytic bone lesions are identified
including sternum (image 53), posterior right fourth rib, lateral
right eighth rib, and lateral left fifth rib.

CT ABDOMEN PELVIS FINDINGS

Hepatobiliary: Multiple ill-defined hypoattenuating liver lesions
are identified involving both hepatic lobes. Index lesion posterior
right liver measures 11 mm on image 53 series 2. There is no
evidence for gallstones, gallbladder wall thickening, or
pericholecystic fluid. No intrahepatic or extrahepatic biliary
dilation.

Pancreas: No focal mass lesion. No dilatation of the main duct. No
intraparenchymal cyst. No peripancreatic edema.

Spleen: Calcified granulomata noted in the spleen. Small
hypoattenuating lesions in the medial spleen and measure up to 12
mm.

Adrenals/Urinary Tract: Thickening of the adrenal glands noted
without discrete nodule or mass. Left kidney surgically absent. Rim
calcified lesion posterior left retroperitoneal space likely
postsurgical [REDACTED] represent chronic hematoma. Mild right
hydronephrosis noted with probable 13 mm cyst interpolar right
kidney. The degree of collecting system distention seems out of
proportion to the degree of right ureteral fullness suggesting a
component of right UPJ obstruction. Bladder is moderately distended.

Stomach/Bowel: Stomach is nondistended. No gastric wall thickening.
No evidence of outlet obstruction. Duodenum is normally positioned
as is the ligament of Treitz. No small bowel wall thickening. No
small bowel dilatation. The terminal ileum is normal. The appendix
is normal. Diverticular changes are noted in the left colon without
evidence of diverticulitis.

Vascular/Lymphatic: Status post aorto bi-iliac bypass grafting.
Upper normal lymph nodes are seen in the hepatoduodenal ligament.
Small retroperitoneal lymph nodes identified. No pelvic sidewall
lymphadenopathy.

Reproductive: Central TURP defect identified in the prostate gland.

Other: No intraperitoneal free fluid.

Musculoskeletal: Multiple lytic bone lesions are identified
including 2.9 cm lesion right L5 vertebral body. Several small lytic
lesions are seen in the right iliac bone.
IMPRESSION: 1. 2.9 cm left upper lobe pulmonary nodule. This is associated with
left hilar lymphadenopathy. Imaging features may be related to
metastatic disease in this patient with a history of renal and
bladder cancer. Primary bronchogenic neoplasm is also consideration.
2. Multiple hypoenhancing lesions scattered through both hepatic
lobes consistent with metastatic disease.
3. Multiple lytic bone lesions identified in the ribs, sternum,
spine, and bony pelvis, consistent with metastatic disease.
4. Hyperattenuating lesion posterior wall distal esophagus,
indeterminate. As this was not visible on the noncontrast CT
yesterday, the hyperattenuation is related to contrast and is
probably oral contrast pooling in a distal esophageal diverticulum
or large ulcer. Enhancement from intravascular contrast of a lesion
such is vascular malformation is less likely.
5. Small hypoattenuating lesions in the medial spleen,
indeterminate.
6.  Aortic Atherosclerois ([id number])
7. With the patient has had recent imaging elsewhere, comparison to
that earlier imaging would likely prove helpful. In the absence of
recent imaging, PET-CT may be beneficial to further evaluate.
